# Patient Record
Sex: Female | Born: 1995 | Race: Black or African American | Hispanic: No | Marital: Single | Smoking: Never smoker
Health system: Southern US, Community
[De-identification: ages and names within clinical notes are randomized; demographics above are authoritative.]

## PROBLEM LIST (undated history)

## (undated) DIAGNOSIS — J45909 Unspecified asthma, uncomplicated: Secondary | ICD-10-CM

## (undated) DIAGNOSIS — L309 Dermatitis, unspecified: Secondary | ICD-10-CM

## (undated) DIAGNOSIS — G43909 Migraine, unspecified, not intractable, without status migrainosus: Secondary | ICD-10-CM

## (undated) HISTORY — DX: Dermatitis, unspecified: L30.9

---
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
## 2016-08-09 ENCOUNTER — Emergency Department (HOSPITAL_BASED_OUTPATIENT_CLINIC_OR_DEPARTMENT_OTHER)
Admission: EM | Admit: 2016-08-09 | Discharge: 2016-08-09 | Disposition: A | Discharge status: Home / Self Care | Payer: BC Managed Care – PPO | Attending: Emergency Medicine | Admitting: Emergency Medicine

## 2016-08-09 ENCOUNTER — Emergency Department (HOSPITAL_BASED_OUTPATIENT_CLINIC_OR_DEPARTMENT_OTHER): Payer: BC Managed Care – PPO

## 2016-08-09 ENCOUNTER — Encounter (HOSPITAL_BASED_OUTPATIENT_CLINIC_OR_DEPARTMENT_OTHER): Payer: Self-pay

## 2016-08-09 DIAGNOSIS — R079 Chest pain, unspecified: Secondary | ICD-10-CM

## 2016-08-09 DIAGNOSIS — R110 Nausea: Secondary | ICD-10-CM | POA: Diagnosis not present

## 2016-08-09 DIAGNOSIS — J029 Acute pharyngitis, unspecified: Secondary | ICD-10-CM | POA: Diagnosis not present

## 2016-08-09 DIAGNOSIS — R0781 Pleurodynia: Secondary | ICD-10-CM | POA: Diagnosis not present

## 2016-08-09 DIAGNOSIS — R05 Cough: Secondary | ICD-10-CM | POA: Insufficient documentation

## 2016-08-09 DIAGNOSIS — R51 Headache: Secondary | ICD-10-CM | POA: Diagnosis not present

## 2016-08-09 LAB — PREGNANCY, URINE: PREG TEST UR: NEGATIVE

## 2016-08-09 LAB — URINALYSIS, ROUTINE W REFLEX MICROSCOPIC
Bilirubin Urine: NEGATIVE
GLUCOSE, UA: NEGATIVE mg/dL
Hgb urine dipstick: NEGATIVE
KETONES UR: NEGATIVE mg/dL
LEUKOCYTES UA: NEGATIVE
NITRITE: NEGATIVE
PROTEIN: NEGATIVE mg/dL
Specific Gravity, Urine: 1.022 (ref 1.005–1.030)
pH: 7 (ref 5.0–8.0)

## 2016-08-09 MED ORDER — NAPROXEN 500 MG PO TABS: 500.0000 mg | ORAL_TABLET | Freq: Two times a day (BID) | ORAL | 0 refills | Status: DC

## 2016-08-09 NOTE — ED Triage Notes (Signed)
Pt c/o substernal chest pain for the last few days, developed cough today.  Saw [facility] and was tested for flu yesterday which was negative

## 2016-08-09 NOTE — ED Notes (Signed)
ED Provider at bedside. 

## 2016-08-09 NOTE — ED Provider Notes (Signed)
[facility]   CSN: [id number] Arrival date & time: 08/09/16  2000  By signing my name below, I, Anna Dillon, attest that this documentation has been prepared under the direction and in the presence of Renne CriglerJoshua Kamyra Schroeck, PA-C.  Electronically Signed: Rosario AdieWilliam Andrew Dillon, ED Scribe. 08/09/16. 10:06 PM.  History   Chief Complaint Chief Complaint  Patient presents with  . Chest Pain   The history is provided by the patient. No language interpreter was used.    HPI Comments: Anna Dillon is a 21 y.o. female with no pertinent PMHx, who presents to the Emergency Department complaining of intermittent right-sided ribcage pain beginning several days ago. She notes associated gradually improving sore throat, headache, and nausea since the onset of her pain. Pt additionally reports associated mild cough which began today. She was seen and evaluated for her pain by her [facility] yesterday. At that time she was screened for influenza which was negative. Pt has been taking Tylenol at home without relief of her symptoms. She is currently on Depo Provera for her birth control. No Hx of PE/DVT, recent long travel, surgery, fracture, prolonged immobilization otherwise. Pt notes that her sister whom she lives with is currently sick with flu-like symptoms. She denies fever, vomiting, diarrhea, leg swelling, hemoptysis, or any other associated symptoms.   History reviewed. No pertinent past medical history.  There are no active problems to display for this patient.  Past Surgical History:  Procedure Laterality Date  . TONSILLECTOMY  10/2015   OB History    No data available     Home Medications    Prior to Admission medications   Not on File   Family History No family history on file.  Social History Social History  Substance Use Topics  . Smoking status: Never Smoker  . Smokeless tobacco: Never Used  . Alcohol use Yes     Comment: rarely    Allergies   Patient has no known allergies.  Review of Systems Review of Systems  Constitutional: Negative for chills, fatigue and fever.  HENT: Positive for sore throat. Negative for congestion, ear pain, rhinorrhea and sinus pressure.   Eyes: Negative for redness.  Respiratory: Positive for cough. Negative for wheezing.        Negative for hemoptysis.  Cardiovascular: Positive for chest pain (right, ribcage). Negative for leg swelling.  Gastrointestinal: Positive for nausea. Negative for abdominal pain, diarrhea and vomiting.          Genitourinary: Negative for dysuria.  Musculoskeletal: Negative for myalgias and neck stiffness.  Skin: Negative for rash.  Neurological: Positive for headaches.  Hematological: Negative for adenopathy.   Physical Exam Updated Vital Signs BP 140/89 (BP Location: Left Arm)   Pulse 80   Temp 98.5 F (36.9 C) (Oral)   Resp 18   Ht 5\' 7"  (1.702 m)   Wt 140 lb (63.5 kg)   SpO2 100%   BMI 21.93 kg/m   Physical Exam  Constitutional: She appears well-developed and well-nourished. No distress.  HENT:  Head: Normocephalic and atraumatic.  Eyes: Conjunctivae are normal. Right eye exhibits no discharge. Left eye exhibits no discharge.  Neck: Normal range of motion. Neck supple.  Cardiovascular: Normal rate, regular rhythm and normal heart sounds.   Pulmonary/Chest: Effort normal and breath sounds normal. She exhibits tenderness (Mild, right anterior inferior ribs).  Abdominal: Soft. She exhibits no distension. There is no tenderness.  Musculoskeletal: Normal range of motion. She exhibits no edema.  Neurological: She is alert.  Skin: Skin is warm and dry. No pallor.  Psychiatric: She has a normal mood and affect. Her behavior is normal.  Nursing note and vitals reviewed.  ED Treatments / Results  DIAGNOSTIC STUDIES: Oxygen Saturation is 100% on RA, normal by my interpretation.   COORDINATION OF CARE: 9:56 PM-Discussed next steps with pt  including usage of OTC medications for symptomatic treatment at home and prescription for cough suppressant. Pt verbalized understanding and is agreeable with the plan.   Labs (all labs ordered are listed, but only abnormal results are displayed) Labs Reviewed  URINALYSIS, ROUTINE W REFLEX MICROSCOPIC  PREGNANCY, URINE    EKG  EKG Interpretation None      Radiology Dg Chest 2 View  Result Date: 08/09/2016 CLINICAL DATA:  Patient with sub sternal chest pain for multiple days. Cough. EXAM: CHEST  2 VIEW COMPARISON:  None. FINDINGS: Normal cardiac and mediastinal contours. No consolidative pulmonary opacities. No pleural effusion or pneumothorax. IMPRESSION: No active cardiopulmonary disease. Electronically Signed   By: Annia Belt M.D.   On: 08/09/2016 21:01   Procedures Procedures   Medications Ordered in ED Medications - No data to display  Initial Impression / Assessment and Plan / ED Course  I have reviewed the triage vital signs and the nursing notes.  Pertinent labs & imaging results that were available during my care of the patient were reviewed by me and considered in my medical decision making (see chart for details).     Vital signs reviewed and are as follows: Vitals:   08/09/16 2014  BP: 140/89  Pulse: 80  Resp: 18  Temp: 98.5 F (36.9 C)   10:11 PM Patient counseled on supportive care  and s/s to return including worsening symptoms, persistent fever, persistent vomiting, or if they have any other concerns. Urged to see PCP if symptoms persist for more than 3 days. Patient verbalizes understanding and agrees with plan.    Final Clinical Impressions(s) / ED Diagnoses   Final diagnoses:  Nonspecific chest pain   Patient with chest wall tenderness, signs and symptoms suspicious for upper respiratory infection. Chest x-ray is clear. Patient is PERC negative. No pneumothorax or pneumonia. Supportive measures with return if symptoms worsen.  New  Prescriptions New Prescriptions   NAPROXEN (NAPROSYN) 500 MG TABLET    Take 1 tablet (500 mg total) by mouth 2 (two) times daily.   I personally performed the services described in this documentation, which was scribed in my presence. The recorded information has been reviewed and is accurate.     Renne Crigler, PA-C 08/09/16 2212    Canary Brim Tegeler, MD 08/10/16 [phone]

## 2016-08-09 NOTE — Discharge Instructions (Signed)
Please read and follow all provided instructions.  Your diagnoses today include:  1. Nonspecific chest pain     Tests performed today include:  Chest x-ray - no pneumonia or other problems  Vital signs. See below for your results today.   Medications prescribed:   Naproxen - anti-inflammatory pain medication  Do not exceed 500mg  naproxen every 12 hours, take with food  You have been prescribed an anti-inflammatory medication or NSAID. Take with food. Take smallest effective dose for the shortest duration needed for your pain. Stop taking if you experience stomach pain or vomiting.   Take any prescribed medications only as directed.  Home care instructions:  Follow any educational materials contained in this packet.  BE VERY CAREFUL not to take multiple medicines containing Tylenol (also called acetaminophen). Doing so can lead to an overdose which can damage your liver and cause liver failure and possibly death.   Follow-up instructions: Please follow-up with your primary care provider in the next 3 days for further evaluation of your symptoms.   Return instructions:   Please return to the Emergency Department if you experience worsening symptoms.   Please return if you have any other emergent concerns.  Additional Information:  Your vital signs today were: BP 140/89 (BP Location: Left Arm)    Pulse 80    Temp 98.5 F (36.9 C) (Oral)    Resp 18    Ht 5\' 7"  (1.702 m)    Wt 63.5 kg    SpO2 100%    BMI 21.93 kg/m  If your blood pressure (BP) was elevated above 135/85 this visit, please have this repeated by your doctor within one month. --------------

## 2016-08-11 ENCOUNTER — Emergency Department (HOSPITAL_BASED_OUTPATIENT_CLINIC_OR_DEPARTMENT_OTHER)
Admission: EM | Admit: 2016-08-11 | Discharge: 2016-08-11 | Disposition: A | Discharge status: Home / Self Care | Payer: BC Managed Care – PPO | Attending: Emergency Medicine | Admitting: Emergency Medicine

## 2016-08-11 ENCOUNTER — Emergency Department (HOSPITAL_BASED_OUTPATIENT_CLINIC_OR_DEPARTMENT_OTHER): Payer: BC Managed Care – PPO

## 2016-08-11 ENCOUNTER — Encounter (HOSPITAL_BASED_OUTPATIENT_CLINIC_OR_DEPARTMENT_OTHER): Payer: Self-pay | Admitting: *Deleted

## 2016-08-11 DIAGNOSIS — R110 Nausea: Secondary | ICD-10-CM | POA: Diagnosis not present

## 2016-08-11 DIAGNOSIS — R1011 Right upper quadrant pain: Secondary | ICD-10-CM | POA: Insufficient documentation

## 2016-08-11 DIAGNOSIS — R109 Unspecified abdominal pain: Secondary | ICD-10-CM

## 2016-08-11 LAB — URINALYSIS, ROUTINE W REFLEX MICROSCOPIC
Bilirubin Urine: NEGATIVE
Glucose, UA: NEGATIVE mg/dL
HGB URINE DIPSTICK: NEGATIVE
Ketones, ur: 15 mg/dL — AB
Nitrite: NEGATIVE
PROTEIN: NEGATIVE mg/dL
SPECIFIC GRAVITY, URINE: 1.031 — AB (ref 1.005–1.030)
pH: 7 (ref 5.0–8.0)

## 2016-08-11 LAB — PREGNANCY, URINE: Preg Test, Ur: NEGATIVE

## 2016-08-11 LAB — URINALYSIS, MICROSCOPIC (REFLEX): RBC / HPF: NONE SEEN RBC/hpf (ref 0–5)

## 2016-08-11 MED ORDER — CEPHALEXIN 500 MG PO CAPS: 500.0000 mg | ORAL_CAPSULE | Freq: Two times a day (BID) | ORAL | 0 refills | Status: DC

## 2016-08-11 MED ORDER — KETOROLAC TROMETHAMINE 30 MG/ML IJ SOLN
30.0000 mg | Freq: Once | INTRAMUSCULAR | Status: AC
  Administered 2016-08-11: 30 mg via INTRAMUSCULAR
  Filled 2016-08-11: qty 1

## 2016-08-11 NOTE — Discharge Instructions (Signed)
Please take the Keflex twice a day for 7 days. May continue your pain medicine at home. May also use Tylenol. Please stay hydrated plenty of water. You need to find a primary care doctor to follow up with. Return to the ED if you developed any worsening symptoms. CAT scan was normal today. Her ultrasound was normal today. Your urine shows signs of infection. Return to the ED if you developed any fevers or worsening urinary symptoms.

## 2016-08-11 NOTE — ED Notes (Signed)
Brought patient a cup of ice at her request and with nurse's approval

## 2016-08-11 NOTE — ED Triage Notes (Signed)
States right side and back pain.  Was seen and treated for same 2 days ago-rx naproxyn-states that she has taken one without relief.

## 2016-08-11 NOTE — ED Notes (Signed)
Pt explained that she could not eat or drink until results came back.

## 2016-08-11 NOTE — ED Provider Notes (Signed)
[facility]   CSN: [id number] Arrival date & time: 08/11/16  1420  By signing my name below, I, Modena Jansky, attest that this documentation has been prepared under the direction and in the presence of non-physician practitioner, Azucena Kuba, PA-C. Electronically Signed: Modena Jansky, Scribe. 08/11/2016. 5:09 PM.  History   Chief Complaint Chief Complaint  Patient presents with  . Back Pain   The history is provided by the patient. No language interpreter was used.   HPI Comments: Anna Dillon is a 21 y.o. female who presents to the Emergency Department complaining of constant moderate right-sided flank pain that started about 5 days ago. She was seen here in the ED on 08/09/16 for the same complaint and discharged with naproxen. Her pain was initially in her right rib pain during ED visit and is now radiating to her right flank. She took one naproxen dose without any relief. Her pain was exacerbated by movement. She reports associated nausea. Not associated with food. She admits to being sexually active and uses protection but has no concern for STD exposure (recently tested). She denies any hx of kidney/abdominal problems, fever, vomiting, diarrhea, constipation, blood in stool, urinary symptoms, vaginal discharge/bleeding, pelvic pain, denies hx of dvt, lower extremity edema, prolonged immobilization, recent hospitalizations/surgeries, ocps, or other complaints.   History reviewed. No pertinent past medical history.  There are no active problems to display for this patient.   Past Surgical History:  Procedure Laterality Date  . TONSILLECTOMY  10/2015    OB History    No data available       Home Medications    Prior to Admission medications   Medication Sig Start Date End Date Taking? Authorizing Provider  naproxen (NAPROSYN) 500 MG tablet Take 1 tablet (500 mg total) by mouth 2 (two) times daily. 08/09/16   Renne Crigler, PA-C    Family  History History reviewed. No pertinent family history.  Social History Social History  Substance Use Topics  . Smoking status: Never Smoker  . Smokeless tobacco: Never Used  . Alcohol use Yes     Comment: rarely     Allergies   Patient has no known allergies.   Review of Systems Review of Systems  Constitutional: Negative for chills and fever.  HENT: Negative for congestion.   Eyes: Negative for visual disturbance.  Respiratory: Negative for cough and shortness of breath.   Cardiovascular: Negative for chest pain, palpitations and leg swelling.  Gastrointestinal: Negative for abdominal pain, blood in stool, constipation, diarrhea and vomiting.  Genitourinary: Positive for flank pain. Negative for dysuria, frequency, hematuria, pelvic pain, urgency, vaginal bleeding and vaginal discharge.  Musculoskeletal: Positive for back pain.  Neurological: Negative for dizziness, syncope, light-headedness and headaches.  All other systems reviewed and are negative.    Physical Exam Updated Vital Signs BP 136/92 (BP Location: Left Arm)   Pulse 83   Temp 98.4 F (36.9 C) (Oral)   Resp 20   Ht 5\' 7"  (1.702 m)   Wt 140 lb (63.5 kg)   SpO2 97%   BMI 21.93 kg/m   Physical Exam  Constitutional: She is oriented to person, place, and time. She appears well-developed and well-nourished. No distress.  Pt is non toxic appearing and playing on cell phone. She is resting comfortable on bed in nad.  HENT:  Head: Normocephalic and atraumatic.  Eyes: Conjunctivae and EOM are normal. Pupils are equal, round, and reactive to light.  Neck: Normal range of motion. Neck  supple.  Cardiovascular: Normal rate, regular rhythm, normal heart sounds and intact distal pulses.  Exam reveals no gallop and no friction rub.   No murmur heard. Pulmonary/Chest: Effort normal and breath sounds normal. She exhibits no tenderness.  CTAB  Abdominal: Soft. Bowel sounds are normal. She exhibits no distension. There  is no tenderness. There is CVA tenderness (right). There is no rebound and no guarding.  Musculoskeletal: Normal range of motion.  Lymphadenopathy:    She has no cervical adenopathy.  Neurological: She is alert and oriented to person, place, and time.  Skin: Skin is warm and dry.  Psychiatric: She has a normal mood and affect.  Nursing note and vitals reviewed.    ED Treatments / Results  DIAGNOSTIC STUDIES: Oxygen Saturation is 97% on RA, normal by my interpretation.    COORDINATION OF CARE: 5:13 PM- Pt advised of plan for treatment and pt agrees.  Labs (all labs ordered are listed, but only abnormal results are displayed) Labs Reviewed  URINALYSIS, ROUTINE W REFLEX MICROSCOPIC - Abnormal; Notable for the following:       Result Value   Color, Urine ORANGE (*)    Specific Gravity, Urine 1.031 (*)    Ketones, ur 15 (*)    Leukocytes, UA SMALL (*)    All other components within normal limits  URINALYSIS, MICROSCOPIC (REFLEX) - Abnormal; Notable for the following:    Bacteria, UA MANY (*)    Squamous Epithelial / LPF 0-5 (*)    All other components within normal limits  URINE CULTURE  PREGNANCY, URINE    EKG  EKG Interpretation None       Radiology [place] Abdomen Complete  Result Date: 08/11/2016 CLINICAL DATA:  Acute onset of right upper quadrant and right flank pain. Nausea and question of dehydration. Dark urine. Initial encounter. EXAM: ABDOMEN ULTRASOUND COMPLETE COMPARISON:  None. FINDINGS: Gallbladder: No gallstones or wall thickening visualized. No sonographic Murphy sign noted by sonographer. Common bile duct: Diameter: 0.3 cm, within normal limits in caliber. Liver: No focal lesion identified. Mildly increased parenchymal echogenicity may reflect fatty infiltration. IVC: No abnormality visualized. Pancreas: Not visualized due to overlying bowel gas. Spleen: Size and appearance within normal limits. Right Kidney: Length: 10.5 cm. Echogenicity within normal limits. No  mass or hydronephrosis visualized. Left Kidney: Length: 10.5 cm. Echogenicity within normal limits. No mass or hydronephrosis visualized. Abdominal aorta: No aneurysm visualized. The abdominal aorta is largely obscured by overlying bowel gas. Other findings: None. IMPRESSION: 1. No acute abnormality seen within the abdomen. Evaluation is mildly suboptimal due to bowel gas. 2. Mild fatty infiltration within the liver. Electronically Signed   By: Roanna RaiderJeffery  Chang M.D.   On: 08/11/2016 18:10   Ct Renal Stone Study  Result Date: 08/11/2016 CLINICAL DATA:  Right-sided flank pain EXAM: CT ABDOMEN AND PELVIS WITHOUT CONTRAST TECHNIQUE: Multidetector CT imaging of the abdomen and pelvis was performed following the standard protocol without IV contrast. COMPARISON:  None. FINDINGS: Lower chest: No acute abnormality. Hepatobiliary: No focal liver abnormality is seen. No gallstones, gallbladder wall thickening, or biliary dilatation. Pancreas: Unremarkable. No pancreatic ductal dilatation or surrounding inflammatory changes. Spleen: Normal in size without focal abnormality. Adrenals/Urinary Tract: The adrenal glands are within normal limits bilaterally. The kidneys are well visualize without evidence of renal calculi or obstructive changes. The bladder is well distended. Stomach/Bowel: Stomach is within normal limits. Appendix appears normal. No evidence of bowel wall thickening, distention, or inflammatory changes. Vascular/Lymphatic: No significant vascular findings are present. No  enlarged abdominal or pelvic lymph nodes. Reproductive: Uterus and bilateral adnexa are unremarkable. Other: No abdominal wall hernia or abnormality. No abdominopelvic ascites. Musculoskeletal: No acute or significant osseous findings. IMPRESSION: No acute abnormality noted. Electronically Signed   By: Alcide Clever M.D.   On: 08/11/2016 19:03    Procedures Procedures (including critical care time)  Medications Ordered in ED Medications   ketorolac (TORADOL) 30 MG/ML injection 30 mg (30 mg Intramuscular Given 08/11/16 1831)     Initial Impression / Assessment and Plan / ED Course  I have reviewed the triage vital signs and the nursing notes.  Pertinent labs & imaging results that were available during my care of the patient were reviewed by me and considered in my medical decision making (see chart for details).     Pt presents with right flank pain and ruq pain. Pt recently seen for same and diagnosed with muscle strain. Given naproxen. Exam consistent with right CVA tenderness. [place] with many bacteria, and small amount of leukocytes. She denies any urinary symptoms. Denies any vaginal complaints. Recently tested for STD and was [facility]. She uses protection with one female partner. No concern for STD. Urine preg is negative. Do not feel pelvic exam is indicated. Ordered a abd [place] to access kidneys for stranding and possible pyelo. Pt endorses nauseas. Denies emesis or fevers. US shows not acute abnormalities. Does show fatty infiltrate of liver. Informed of findings. Pt states she is still in pain and is requesting that we perform a ct bc she things that it is an appendicitis. Dicussed with pt risk of ct scans. She would like it performed. CT is unremarkable. Appendix is normal. Kidney is norma. Pt is PERC negative. Lows suspicion for PE. Pt is improved with Toradol. Will treat with kefelx for 7 days for possible UTI. Urine cultured. Feel that pain is msk. Encouraged her to continue naproxen. And follow up with pcp.Pt is non toxic appearing. She is not febrile, tachycardic, hypoxic or tachypneic. She is sleeping on my reexamination. No red flag symptoms for back pain. Pt is hemodynamically stable, in NAD, & able to ambulate in the ED. Pain has been managed & has no complaints prior to dc. Pt is comfortable with above plan and is stable for discharge at this time. All questions were answered prior to disposition. Strict  return precautions for f/u to the ED were discussed.   Final Clinical Impressions(s) / ED Diagnoses   Final diagnoses:  Right flank pain  RUQ pain    New Prescriptions Discharge Medication List as of 08/11/2016  8:14 PM    START taking these medications   Details  cephALEXin (KEFLEX) 500 MG capsule Take 1 capsule (500 mg total) by mouth 2 (two) times daily., Starting Sat 08/11/2016, Print       I personally performed the services described in this documentation, which was scribed in my presence. The recorded information has been reviewed and is accurate.     Rise Mu, PA-C 08/12/16 1426    Rolland Porter, MD 08/19/16 [phone]

## 2016-08-13 LAB — URINE CULTURE

## 2016-11-05 ENCOUNTER — Emergency Department (HOSPITAL_BASED_OUTPATIENT_CLINIC_OR_DEPARTMENT_OTHER)
Admission: EM | Admit: 2016-11-05 | Discharge: 2016-11-06 | Disposition: A | Discharge status: Home / Self Care | Payer: BC Managed Care – PPO | Attending: Emergency Medicine | Admitting: Emergency Medicine

## 2016-11-05 ENCOUNTER — Encounter (HOSPITAL_BASED_OUTPATIENT_CLINIC_OR_DEPARTMENT_OTHER): Payer: Self-pay | Admitting: *Deleted

## 2016-11-05 ENCOUNTER — Emergency Department (HOSPITAL_BASED_OUTPATIENT_CLINIC_OR_DEPARTMENT_OTHER): Payer: BC Managed Care – PPO

## 2016-11-05 DIAGNOSIS — R1032 Left lower quadrant pain: Secondary | ICD-10-CM

## 2016-11-05 DIAGNOSIS — Z711 Person with feared health complaint in whom no diagnosis is made: Secondary | ICD-10-CM

## 2016-11-05 DIAGNOSIS — N898 Other specified noninflammatory disorders of vagina: Secondary | ICD-10-CM | POA: Diagnosis not present

## 2016-11-05 DIAGNOSIS — R110 Nausea: Secondary | ICD-10-CM | POA: Insufficient documentation

## 2016-11-05 DIAGNOSIS — R102 Pelvic and perineal pain: Secondary | ICD-10-CM

## 2016-11-05 DIAGNOSIS — Z202 Contact with and (suspected) exposure to infections with a predominantly sexual mode of transmission: Secondary | ICD-10-CM | POA: Insufficient documentation

## 2016-11-05 DIAGNOSIS — R197 Diarrhea, unspecified: Secondary | ICD-10-CM | POA: Diagnosis not present

## 2016-11-05 LAB — URINALYSIS, ROUTINE W REFLEX MICROSCOPIC
Bilirubin Urine: NEGATIVE
Glucose, UA: NEGATIVE mg/dL
Hgb urine dipstick: NEGATIVE
KETONES UR: NEGATIVE mg/dL
LEUKOCYTES UA: NEGATIVE
NITRITE: NEGATIVE
PROTEIN: NEGATIVE mg/dL
Specific Gravity, Urine: 1.029 (ref 1.005–1.030)
pH: 6 (ref 5.0–8.0)

## 2016-11-05 LAB — CBC WITH DIFFERENTIAL/PLATELET
BASOS ABS: 0 10*3/uL (ref 0.0–0.1)
Basophils Relative: 1 %
EOS PCT: 0 %
Eosinophils Absolute: 0 10*3/uL (ref 0.0–0.7)
HEMATOCRIT: 37.2 % (ref 36.0–46.0)
Hemoglobin: 13.1 g/dL (ref 12.0–15.0)
LYMPHS ABS: 2.4 10*3/uL (ref 0.7–4.0)
LYMPHS PCT: 32 %
MCH: 30.8 pg (ref 26.0–34.0)
MCHC: 35.2 g/dL (ref 30.0–36.0)
MCV: 87.3 fL (ref 78.0–100.0)
MONO ABS: 0.5 10*3/uL (ref 0.1–1.0)
Monocytes Relative: 6 %
NEUTROS ABS: 4.7 10*3/uL (ref 1.7–7.7)
NEUTROS PCT: 61 %
Platelets: 197 10*3/uL (ref 150–400)
RBC: 4.26 MIL/uL (ref 3.87–5.11)
RDW: 12.6 % (ref 11.5–15.5)
WBC: 7.7 10*3/uL (ref 4.0–10.5)

## 2016-11-05 LAB — COMPREHENSIVE METABOLIC PANEL
ALT: 16 U/L (ref 14–54)
AST: 20 U/L (ref 15–41)
Albumin: 4 g/dL (ref 3.5–5.0)
Alkaline Phosphatase: 40 U/L (ref 38–126)
Anion gap: 7 (ref 5–15)
BILIRUBIN TOTAL: 0.6 mg/dL (ref 0.3–1.2)
BUN: 8 mg/dL (ref 6–20)
CO2: 22 mmol/L (ref 22–32)
CREATININE: 0.72 mg/dL (ref 0.44–1.00)
Calcium: 8.8 mg/dL — ABNORMAL LOW (ref 8.9–10.3)
Chloride: 109 mmol/L (ref 101–111)
Glucose, Bld: 93 mg/dL (ref 65–99)
Potassium: 3.7 mmol/L (ref 3.5–5.1)
Sodium: 138 mmol/L (ref 135–145)
TOTAL PROTEIN: 6.6 g/dL (ref 6.5–8.1)

## 2016-11-05 LAB — WET PREP, GENITAL
Clue Cells Wet Prep HPF POC: NONE SEEN
Sperm: NONE SEEN
Trich, Wet Prep: NONE SEEN
YEAST WET PREP: NONE SEEN

## 2016-11-05 LAB — PREGNANCY, URINE: PREG TEST UR: NEGATIVE

## 2016-11-05 LAB — LIPASE, BLOOD: LIPASE: 28 U/L (ref 11–51)

## 2016-11-05 MED ORDER — ONDANSETRON 4 MG PO TBDP
4.0000 mg | ORAL_TABLET | Freq: Once | ORAL | Status: AC
  Administered 2016-11-06: 4 mg via ORAL
  Filled 2016-11-05: qty 1

## 2016-11-05 MED ORDER — ACETAMINOPHEN 325 MG PO TABS
650.0000 mg | ORAL_TABLET | Freq: Once | ORAL | Status: AC
  Administered 2016-11-05: 650 mg via ORAL
  Filled 2016-11-05: qty 2

## 2016-11-05 NOTE — ED Notes (Signed)
ED Provider at bedside. 

## 2016-11-05 NOTE — ED Notes (Signed)
Patient transported to Ultrasound 

## 2016-11-05 NOTE — ED Notes (Signed)
Went to room to draw blood but pt is in US at this time.

## 2016-11-05 NOTE — ED Provider Notes (Signed)
[facility] Note   CSN: [id number] Arrival date & time: 11/05/16  2154  By signing my name below, I, Nelwyn Salisbury, attest that this documentation has been prepared under the direction and in the presence of non-physician practitioner, Everlene Farrier, PA-C.  Electronically Signed: Nelwyn Salisbury, Scribe. 11/05/2016. 10:11 PM.  History   Chief Complaint Chief Complaint  Patient presents with  . Abdominal Pain   The history is provided by the patient. No language interpreter was used.    HPI Comments:  Anna Dillon is an otherwise healthy 21 y.o. female who presents to the Emergency Department complaining of constant, mild, left-sided abdominal pain beginning last night. She describes her pain as a burning sensation. Pt reports associated nausea, one episode of diarrhea, and dark vaginal discharge. She notes that she is sexually active and does not use protection but is currently taking Depo for birth control. No previous abdominal surgeries. No treatments prior to arrival. Denies any fevers, dysuria, hematuria, pelvic pain or burping. No abdominal shx.   History reviewed. No pertinent past medical history.  There are no active problems to display for this patient.   Past Surgical History:  Procedure Laterality Date  . TONSILLECTOMY  10/2015    OB History    No data available       Home Medications    Prior to Admission medications   Medication Sig Start Date End Date Taking? Authorizing Provider  doxycycline (VIBRAMYCIN) 100 MG capsule Take 1 capsule (100 mg total) by mouth 2 (two) times daily. 11/06/16   Everlene Farrier, PA-C  naproxen (NAPROSYN) 250 MG tablet Take 1 tablet (250 mg total) by mouth 2 (two) times daily with a meal. 11/06/16   Everlene Farrier, PA-C  ondansetron (ZOFRAN ODT) 4 MG disintegrating tablet Take 1 tablet (4 mg total) by mouth every 8 (eight) hours as needed for nausea or vomiting. 11/06/16   Everlene Farrier, PA-C    Family History No  family history on file.  Social History Social History  Substance Use Topics  . Smoking status: Never Smoker  . Smokeless tobacco: Never Used  . Alcohol use Yes     Comment: rarely     Allergies   Augmentin [amoxicillin-pot clavulanate]   Review of Systems Review of Systems  Constitutional: Negative for chills and fever.  HENT: Negative for congestion and mouth sores.        Negative for burping  Eyes: Negative for visual disturbance.  Respiratory: Negative for cough and shortness of breath.   Cardiovascular: Negative for chest pain.  Gastrointestinal: Positive for abdominal pain, diarrhea and nausea. Negative for constipation and vomiting.  Genitourinary: Positive for vaginal discharge. Negative for difficulty urinating, dysuria, frequency, hematuria and vaginal bleeding.  Musculoskeletal: Negative for back pain and neck pain.  Skin: Negative for rash.  Neurological: Negative for headaches.     Physical Exam Updated Vital Signs BP 118/86   Pulse 91   Temp 98.6 F (37 C) (Oral)   Resp 18   Ht 5' 7.5" (1.715 m)   Wt 65.8 kg   SpO2 99%   BMI 22.38 kg/m   Physical Exam  Constitutional: She appears well-developed and well-nourished. No distress.  Nontoxic appearing.  HENT:  Head: Normocephalic and atraumatic.  Mouth/Throat: Oropharynx is clear and moist.  Eyes: Conjunctivae are normal. Pupils are equal, round, and reactive to light. Right eye exhibits no discharge. Left eye exhibits no discharge.  Neck: Neck supple.  Cardiovascular: Normal rate, regular rhythm, normal heart  sounds and intact distal pulses.  Exam reveals no gallop and no friction rub.   No murmur heard. Pulmonary/Chest: Effort normal and breath sounds normal. No respiratory distress. She has no wheezes. She has no rales.  Abdominal: Soft. Bowel sounds are normal. She exhibits no distension and no mass. There is tenderness. There is no rebound and no guarding. No hernia.  Abdomen is soft.  Bowel  sounds present. LLQ tenderness to palpation. No rebound tenderness. No CVA or flank tenderness. No Psoas or Obturator sign. No peritoneal signs.   Genitourinary: Vaginal discharge found.  Genitourinary Comments: Pelvic exam performed by me with female nurse tech chaperone. No external lesions or rashes noted. Mild amount of dark vaginal discharge noted. No vaginal bleeding. Cervix is closed. No cervical motion tenderness. Mild tenderness noted to her left adnexal area.  Musculoskeletal: She exhibits no edema.  Lymphadenopathy:    She has no cervical adenopathy.  Neurological: She is alert. Coordination normal.  Skin: Skin is warm and dry. Capillary refill takes less than 2 seconds. No rash noted. She is not diaphoretic. No erythema. No pallor.  Psychiatric: She has a normal mood and affect. Her behavior is normal.  Nursing note and vitals reviewed.   ED Treatments / Results  DIAGNOSTIC STUDIES:  Oxygen Saturation is 99% on RA, normal by my interpretation.    COORDINATION OF CARE:  10:31 PM Discussed treatment plan with pt at bedside which includes a pelvic exam and pt agreed to plan.  Labs (all labs ordered are listed, but only abnormal results are displayed) Labs Reviewed  WET PREP, GENITAL - Abnormal; Notable for the following:       Result Value   WBC, Wet Prep HPF POC MANY (*)    All other components within normal limits  COMPREHENSIVE METABOLIC PANEL - Abnormal; Notable for the following:    Calcium 8.8 (*)    All other components within normal limits  URINALYSIS, ROUTINE W REFLEX MICROSCOPIC  PREGNANCY, URINE  CBC WITH DIFFERENTIAL/PLATELET  LIPASE, BLOOD  GC/CHLAMYDIA PROBE AMP ([place]) NOT AT [facility]    EKG  EKG Interpretation None       Radiology US Transvaginal Non-ob  Result Date: 11/05/2016 CLINICAL DATA:  21 y/o  F; left lower quadrant burning pain. EXAM: TRANSABDOMINAL AND TRANSVAGINAL ULTRASOUND OF PELVIS DOPPLER ULTRASOUND OF OVARIES TECHNIQUE: Both  transabdominal and transvaginal ultrasound examinations of the pelvis were performed. Transabdominal technique was performed for global imaging of the pelvis including uterus, ovaries, adnexal regions, and pelvic cul-de-sac. It was necessary to proceed with endovaginal exam following the transabdominal exam to visualize the endometrium and adnexa. Color and duplex Doppler ultrasound was utilized to evaluate blood flow to the ovaries. COMPARISON:  08/11/2016 CT abdomen and pelvis FINDINGS: Uterus Measurements: 6 x 2.5 x 3.3 cm. No fibroids or other mass visualized. Endometrium Thickness: 3.1 mm.  No focal abnormality visualized. Right ovary Measurements: 3.9 x 2.2 x 2.6 cm. Normal appearance/no adnexal mass. Left ovary Measurements: 3.4 x 2.2 x 1.9 cm. Normal appearance/no adnexal mass. Pulsed Doppler evaluation of both ovaries demonstrates normal low-resistance arterial and venous waveforms. Other findings No abnormal free fluid. IMPRESSION: No acute process identified. Unremarkable pelvic ultrasound and Doppler. Electronically Signed   By: Mitzi Hansen M.D.   On: 11/05/2016 23:13   US Pelvis Complete  Result Date: 11/05/2016 CLINICAL DATA:  21 y/o  F; left lower quadrant burning pain. EXAM: TRANSABDOMINAL AND TRANSVAGINAL ULTRASOUND OF PELVIS DOPPLER ULTRASOUND OF OVARIES TECHNIQUE: Both transabdominal and  transvaginal ultrasound examinations of the pelvis were performed. Transabdominal technique was performed for global imaging of the pelvis including uterus, ovaries, adnexal regions, and pelvic cul-de-sac. It was necessary to proceed with endovaginal exam following the transabdominal exam to visualize the endometrium and adnexa. Color and duplex Doppler ultrasound was utilized to evaluate blood flow to the ovaries. COMPARISON:  08/11/2016 CT abdomen and pelvis FINDINGS: Uterus Measurements: 6 x 2.5 x 3.3 cm. No fibroids or other mass visualized. Endometrium Thickness: 3.1 mm.  No focal abnormality  visualized. Right ovary Measurements: 3.9 x 2.2 x 2.6 cm. Normal appearance/no adnexal mass. Left ovary Measurements: 3.4 x 2.2 x 1.9 cm. Normal appearance/no adnexal mass. Pulsed Doppler evaluation of both ovaries demonstrates normal low-resistance arterial and venous waveforms. Other findings No abnormal free fluid. IMPRESSION: No acute process identified. Unremarkable pelvic ultrasound and Doppler. Electronically Signed   By: Mitzi Hansen M.D.   On: 11/05/2016 23:13   [place] Art/ven Flow Abd Pelv Doppler  Result Date: 11/05/2016 CLINICAL DATA:  21 y/o  F; left lower quadrant burning pain. EXAM: TRANSABDOMINAL AND TRANSVAGINAL ULTRASOUND OF PELVIS DOPPLER ULTRASOUND OF OVARIES TECHNIQUE: Both transabdominal and transvaginal ultrasound examinations of the pelvis were performed. Transabdominal technique was performed for global imaging of the pelvis including uterus, ovaries, adnexal regions, and pelvic cul-de-sac. It was necessary to proceed with endovaginal exam following the transabdominal exam to visualize the endometrium and adnexa. Color and duplex Doppler ultrasound was utilized to evaluate blood flow to the ovaries. COMPARISON:  08/11/2016 CT abdomen and pelvis FINDINGS: Uterus Measurements: 6 x 2.5 x 3.3 cm. No fibroids or other mass visualized. Endometrium Thickness: 3.1 mm.  No focal abnormality visualized. Right ovary Measurements: 3.9 x 2.2 x 2.6 cm. Normal appearance/no adnexal mass. Left ovary Measurements: 3.4 x 2.2 x 1.9 cm. Normal appearance/no adnexal mass. Pulsed Doppler evaluation of both ovaries demonstrates normal low-resistance arterial and venous waveforms. Other findings No abnormal free fluid. IMPRESSION: No acute process identified. Unremarkable pelvic ultrasound and Doppler. Electronically Signed   By: Mitzi Hansen M.D.   On: 11/05/2016 23:13    Procedures Procedures (including critical care time)  Medications Ordered in ED Medications  acetaminophen  (TYLENOL) tablet 650 mg (650 mg Oral Given 11/05/16 2318)  ondansetron (ZOFRAN-ODT) disintegrating tablet 4 mg (4 mg Oral Given 11/06/16 0034)  cefTRIAXone (ROCEPHIN) injection 250 mg (250 mg Intramuscular Given 11/06/16 0033)  azithromycin (ZITHROMAX) tablet 1,000 mg (1,000 mg Oral Given 11/06/16 0033)     Initial Impression / Assessment and Plan / ED Course  I have reviewed the triage vital signs and the nursing notes.  Pertinent labs & imaging results that were available during my care of the patient were reviewed by me and considered in my medical decision making (see chart for details).     This is an otherwise healthy 21 y.o. female who presents to the Emergency Department complaining of constant, mild, left-sided abdominal pain beginning last night. She describes her pain as a burning sensation. Pt reports associated nausea, one episode of diarrhea, and dark vaginal discharge. She notes that she is sexually active and does not use protection but is currently taking Depo for birth control. No previous abdominal surgeries. On exam the patient is afebrile and nontoxic appearing. Her abdomen is soft and she is left lower quadrant tenderness to palpation. On pelvic exam she has some dark brown vaginal discharge. Cervix is) she has no cervical motion tenderness but she does have some left adnexal tenderness. Pregnancy test is negative.  Urinalysis is without sign of infection. Lipase is within normal limits. CBC is unremarkable. No leukocytosis. CMP is unremarkable. Pelvic ultrasound shows no evidence of torsion or suspicious masses. Wet prep returned showing many white blood cells. Concern for STD. Pending gonorrhea and Chlamydia testing. Patient agrees to treatment with antibiotics here prophylactically for gonorrhea and chlamydia. As she is having pelvic pain as well will treat her for PID with doxy, despite her lack of cervical motion tenderness. I discussed strict and specific return precautions. I  extensively discussed safe sex practices. I encouraged her to follow-up at the health department and with her OB. She is tolerating PO at reevaluation. I advised the patient to follow-up with their primary care provider this week. I advised the patient to return to the emergency department with new or worsening symptoms or new concerns. The patient verbalized understanding and agreement with plan.      Final Clinical Impressions(s) / ED Diagnoses   Final diagnoses:  Left lower quadrant pain  Concern about STD in female without diagnosis  Pelvic pain in female  Nausea    New Prescriptions New Prescriptions   DOXYCYCLINE (VIBRAMYCIN) 100 MG CAPSULE    Take 1 capsule (100 mg total) by mouth 2 (two) times daily.   NAPROXEN (NAPROSYN) 250 MG TABLET    Take 1 tablet (250 mg total) by mouth 2 (two) times daily with a meal.   ONDANSETRON (ZOFRAN ODT) 4 MG DISINTEGRATING TABLET    Take 1 tablet (4 mg total) by mouth every 8 (eight) hours as needed for nausea or vomiting.   I personally performed the services described in this documentation, which was scribed in my presence. The recorded information has been reviewed and is accurate.      Everlene FarrierDansie, Montine Hight, PA-C 11/06/16 0041    Maia PlanLong, Joshua G, MD 11/06/16 1115

## 2016-11-05 NOTE — ED Triage Notes (Signed)
Abdominal pain and nausea since last night. Burning feeling on the left of her mid abdomen.

## 2016-11-05 NOTE — ED Notes (Signed)
Pt remains in US

## 2016-11-06 DIAGNOSIS — Z202 Contact with and (suspected) exposure to infections with a predominantly sexual mode of transmission: Secondary | ICD-10-CM | POA: Diagnosis not present

## 2016-11-06 MED ORDER — ONDANSETRON 4 MG PO TBDP: 4.0000 mg | ORAL_TABLET | Freq: Three times a day (TID) | ORAL | 0 refills | Status: DC | PRN

## 2016-11-06 MED ORDER — AZITHROMYCIN 250 MG PO TABS
1000.0000 mg | ORAL_TABLET | Freq: Once | ORAL | Status: AC
  Administered 2016-11-06: 1000 mg via ORAL
  Filled 2016-11-06: qty 4

## 2016-11-06 MED ORDER — DOXYCYCLINE HYCLATE 100 MG PO CAPS: 100.0000 mg | ORAL_CAPSULE | Freq: Two times a day (BID) | ORAL | 0 refills | Status: DC

## 2016-11-06 MED ORDER — NAPROXEN 250 MG PO TABS: 250.0000 mg | ORAL_TABLET | Freq: Two times a day (BID) | ORAL | 0 refills | Status: DC

## 2016-11-06 MED ORDER — CEFTRIAXONE SODIUM 250 MG IJ SOLR
250.0000 mg | Freq: Once | INTRAMUSCULAR | Status: AC
  Administered 2016-11-06: 250 mg via INTRAMUSCULAR
  Filled 2016-11-06: qty 250

## 2016-11-06 NOTE — ED Notes (Signed)
Pt verbalizes understanding of d/c instructions and denies any further needs at this time. 

## 2016-11-07 LAB — GC/CHLAMYDIA PROBE AMP (~~LOC~~) NOT AT ARMC
Chlamydia: NEGATIVE
Neisseria Gonorrhea: NEGATIVE

## 2016-12-09 ENCOUNTER — Encounter (HOSPITAL_BASED_OUTPATIENT_CLINIC_OR_DEPARTMENT_OTHER): Payer: Self-pay | Admitting: *Deleted

## 2016-12-09 ENCOUNTER — Emergency Department (HOSPITAL_BASED_OUTPATIENT_CLINIC_OR_DEPARTMENT_OTHER): Payer: BC Managed Care – PPO

## 2016-12-09 ENCOUNTER — Emergency Department (HOSPITAL_BASED_OUTPATIENT_CLINIC_OR_DEPARTMENT_OTHER)
Admission: EM | Admit: 2016-12-09 | Discharge: 2016-12-10 | Disposition: A | Discharge status: Home / Self Care | Payer: BC Managed Care – PPO | Attending: Emergency Medicine | Admitting: Emergency Medicine

## 2016-12-09 DIAGNOSIS — M533 Sacrococcygeal disorders, not elsewhere classified: Secondary | ICD-10-CM | POA: Diagnosis not present

## 2016-12-09 DIAGNOSIS — M545 Low back pain: Secondary | ICD-10-CM | POA: Diagnosis present

## 2016-12-09 LAB — PREGNANCY, URINE: PREG TEST UR: NEGATIVE

## 2016-12-09 LAB — URINALYSIS, ROUTINE W REFLEX MICROSCOPIC
BILIRUBIN URINE: NEGATIVE
GLUCOSE, UA: NEGATIVE mg/dL
Hgb urine dipstick: NEGATIVE
KETONES UR: NEGATIVE mg/dL
Leukocytes, UA: NEGATIVE
Nitrite: NEGATIVE
PROTEIN: NEGATIVE mg/dL
Specific Gravity, Urine: 1.026 (ref 1.005–1.030)
pH: 6.5 (ref 5.0–8.0)

## 2016-12-09 MED ORDER — METHOCARBAMOL 500 MG PO TABS
750.0000 mg | ORAL_TABLET | Freq: Once | ORAL | Status: AC
  Administered 2016-12-10: 750 mg via ORAL
  Filled 2016-12-09: qty 2

## 2016-12-09 MED ORDER — KETOROLAC TROMETHAMINE 60 MG/2ML IM SOLN
30.0000 mg | Freq: Once | INTRAMUSCULAR | Status: AC
  Administered 2016-12-10: 30 mg via INTRAMUSCULAR
  Filled 2016-12-09: qty 2

## 2016-12-09 NOTE — ED Triage Notes (Signed)
Pt states she hit her lower back (coccyx) on the corner of a wall on Mother's Day. Pain has gotten worse over the past few days.

## 2016-12-09 NOTE — ED Provider Notes (Signed)
[facility]   CSN: [id number] Arrival date & time: 12/09/16  2221   By signing my name below, I, Soijett Blue, attest that this documentation has been prepared under the direction and in the presence of Jaynie Crumble, [employer] Electronically Signed: Soijett Blue, ED Scribe. 12/09/16. 11:46 PM.  History   Chief Complaint Chief Complaint  Patient presents with  . Back Pain    HPI Anna Dillon is a 21 y.o. female who presents to the Emergency Department complaining of gradually worsening, lower back pain onset 1 month ago. Pt has not tried any medications for the relief of her symptoms. She notes that she was being carried by her younger sibling and upon getting down, she struck her lower back on the corner of a wall approximately 1 month ago. Pt reports that she felt immediate pain to her lower back following the incident and were mildly alleviated shortly after. Pt notes that she is unsure of what exacerbated her lower back pain. Pt lower back pain is worsened with laying on her back and with movement. Denies fever, chills, color change, wound, recent injury, and any other symptoms.    The history is provided by the patient. No language interpreter was used.    History reviewed. No pertinent past medical history.  There are no active problems to display for this patient.   Past Surgical History:  Procedure Laterality Date  . TONSILLECTOMY  10/2015    OB History    No data available       Home Medications    Prior to Admission medications   Medication Sig Start Date End Date Taking? Authorizing Provider  doxycycline (VIBRAMYCIN) 100 MG capsule Take 1 capsule (100 mg total) by mouth 2 (two) times daily. 11/06/16   Everlene Farrier, PA-C  naproxen (NAPROSYN) 250 MG tablet Take 1 tablet (250 mg total) by mouth 2 (two) times daily with a meal. 11/06/16   Everlene Farrier, PA-C  ondansetron (ZOFRAN ODT) 4 MG disintegrating tablet Take 1 tablet (4 mg total)  by mouth every 8 (eight) hours as needed for nausea or vomiting. 11/06/16   Everlene Farrier, PA-C    Family History History reviewed. No pertinent family history.  Social History Social History  Substance Use Topics  . Smoking status: Never Smoker  . Smokeless tobacco: Never Used  . Alcohol use Yes     Comment: rarely     Allergies   Augmentin [amoxicillin-pot clavulanate]   Review of Systems Review of Systems  Constitutional: Negative for chills and fever.  Musculoskeletal: Positive for back pain (lower).  Skin: Negative for color change and wound.  Neurological: Negative for weakness and numbness.     Physical Exam Updated Vital Signs BP 122/78 (BP Location: Left Arm)   Pulse 75   Temp 99 F (37.2 C) (Oral)   Resp 16   Ht 5\' 7"  (1.702 m)   Wt 135 lb (61.2 kg)   SpO2 100%   BMI 21.14 kg/m   Physical Exam  Constitutional: She is oriented to person, place, and time. She appears well-developed and well-nourished. No distress.  HENT:  Head: Normocephalic and atraumatic.  Eyes: EOM are normal.  Neck: Neck supple.  Cardiovascular: Normal rate.   Pulmonary/Chest: Effort normal. No respiratory distress.  Abdominal: She exhibits no distension.  Musculoskeletal: Normal range of motion.  Tenderness to palpation with midline sacrum and upper gluteal cleft. There is no skin discoloration or erythema. There is no skin induration. There is no  palpable soft tissue swelling. Full range of motion of bilateral lower extremities. Pain with forward flexion. No tenderness over lumbar spine or SI joints bilaterally. No tenderness over coccyx.  Neurological: She is alert and oriented to person, place, and time.  Skin: Skin is warm and dry.  Psychiatric: She has a normal mood and affect. Her behavior is normal.  Nursing note and vitals reviewed.    ED Treatments / Results  DIAGNOSTIC STUDIES: Oxygen Saturation is 100% on RA, nl by my interpretation.    COORDINATION OF  CARE: 11:50 PM Discussed treatment plan with pt at bedside and pt agreed to plan.   Labs (all labs ordered are listed, but only abnormal results are displayed) Labs Reviewed  PREGNANCY, URINE  URINALYSIS, ROUTINE W REFLEX MICROSCOPIC    EKG  EKG Interpretation None       Radiology Dg Sacrum/coccyx  Result Date: 12/09/2016 CLINICAL DATA:  Status post fall into wall 3 weeks ago, with acute onset of sacral pain. Initial encounter. EXAM: SACRUM AND COCCYX - 2+ VIEW COMPARISON:  CT of the abdomen and pelvis performed 08/11/2016 FINDINGS: There is no evidence of fracture or dislocation. The sacrum and coccyx appear grossly intact. The sacroiliac joints are unremarkable. The visualized bowel gas pattern is unremarkable. A metallic piercing is noted overlying the umbilicus. IMPRESSION: No evidence of fracture or dislocation. Electronically Signed   By: Roanna RaiderJeffery  Chang M.D.   On: 12/09/2016 23:35    Procedures Procedures (including critical care time)  Medications Ordered in ED Medications - No data to display   Initial Impression / Assessment and Plan / ED Course  I have reviewed the triage vital signs and the nursing notes.  Pertinent labs & imaging results that were available during my care of the patient were reviewed by me and considered in my medical decision making (see chart for details).   patient with pain to the lower sacrum and upper coccyx, injury one month ago which improved and started again one day ago. On exam, patient has tenderness to the upper gluteal cleft. She does have history of pilonidal cyst that was drained, however at this time there is no skin changes, no palpable induration or fluctuance to suggest another pilonidal abscess. Patient is also reporting injury to that area one month ago which improved but now came back, but denies any recurrent injuries. She does have pain with sitting and laying on that area as well as walking and movement. Differential includes  musculoskeletal pain versus developing pilonidal cyst. Old start her on NSAIDs, instructed to try warm soaks or heating pads. Will add Robaxin. Stretches. Follow-up as needed. Return if any skin discoloration, induration, warmth to the touch, swelling to the area.   Vitals:   12/09/16 2235  BP: 122/78  Pulse: 75  Resp: 16  Temp: 99 F (37.2 C)  TempSrc: Oral  SpO2: 100%  Weight: 61.2 kg (135 lb)  Height: 5\' 7"  (1.702 m)    Final Clinical Impressions(s) / ED Diagnoses   Final diagnoses:  Coccyx pain    New Prescriptions New Prescriptions   METHOCARBAMOL (ROBAXIN) 500 MG TABLET    Take 1 tablet (500 mg total) by mouth 2 (two) times daily.   NAPROXEN (NAPROSYN) 500 MG TABLET    Take 1 tablet (500 mg total) by mouth 2 (two) times daily.   I personally performed the services described in this documentation, which was scribed in my presence. The recorded information has been reviewed and is accurate.  Jaynie Crumble, PA-C 12/10/16 0007    Maia Plan, MD 12/10/16 1036

## 2016-12-10 DIAGNOSIS — M533 Sacrococcygeal disorders, not elsewhere classified: Secondary | ICD-10-CM | POA: Diagnosis not present

## 2016-12-10 MED ORDER — NAPROXEN 500 MG PO TABS: 500.0000 mg | ORAL_TABLET | Freq: Two times a day (BID) | ORAL | 0 refills | Status: DC

## 2016-12-10 MED ORDER — METHOCARBAMOL 500 MG PO TABS: 500.0000 mg | ORAL_TABLET | Freq: Two times a day (BID) | ORAL | 0 refills | Status: DC

## 2016-12-10 NOTE — Discharge Instructions (Signed)
Your x-ray today was normal. I'm suspicious that he may be developing a pilonidal abscess. Warm soaks several times a day. Keep an eye on the area, if becomes red, more swollen, please follow-up on return to emergency department for drainage. Otherwise try heating pads, and take ibuprofen or naproxen for pain. Take Tylenol for additional pain relief. Take robaxin for muscle spasms. Try heating pads. Stretches. Follow up with family doctor if not improving.

## 2017-02-19 ENCOUNTER — Encounter (HOSPITAL_BASED_OUTPATIENT_CLINIC_OR_DEPARTMENT_OTHER): Payer: Self-pay | Admitting: Emergency Medicine

## 2017-02-19 ENCOUNTER — Emergency Department (HOSPITAL_BASED_OUTPATIENT_CLINIC_OR_DEPARTMENT_OTHER)
Admission: EM | Admit: 2017-02-19 | Discharge: 2017-02-19 | Disposition: A | Discharge status: Home / Self Care | Payer: BC Managed Care – PPO | Attending: Emergency Medicine | Admitting: Emergency Medicine

## 2017-02-19 DIAGNOSIS — G43009 Migraine without aura, not intractable, without status migrainosus: Secondary | ICD-10-CM | POA: Diagnosis not present

## 2017-02-19 DIAGNOSIS — Z79899 Other long term (current) drug therapy: Secondary | ICD-10-CM | POA: Diagnosis not present

## 2017-02-19 DIAGNOSIS — R51 Headache: Secondary | ICD-10-CM | POA: Diagnosis present

## 2017-02-19 HISTORY — DX: Migraine, unspecified, not intractable, without status migrainosus: G43.909

## 2017-02-19 LAB — PREGNANCY, URINE: PREG TEST UR: NEGATIVE

## 2017-02-19 MED ORDER — DIPHENHYDRAMINE HCL 12.5 MG/5ML PO ELIX
12.5000 mg | ORAL_SOLUTION | Freq: Once | ORAL | Status: AC
  Administered 2017-02-19: 12.5 mg via ORAL
  Filled 2017-02-19: qty 10

## 2017-02-19 MED ORDER — KETOROLAC TROMETHAMINE 60 MG/2ML IM SOLN
60.0000 mg | Freq: Once | INTRAMUSCULAR | Status: AC
  Administered 2017-02-19: 60 mg via INTRAMUSCULAR
  Filled 2017-02-19: qty 2

## 2017-02-19 MED ORDER — METOCLOPRAMIDE HCL 5 MG/ML IJ SOLN
10.0000 mg | Freq: Once | INTRAMUSCULAR | Status: AC
  Administered 2017-02-19: 10 mg via INTRAMUSCULAR
  Filled 2017-02-19: qty 2

## 2017-02-19 NOTE — ED Triage Notes (Signed)
Pt with HA x 6 hours denies N/V or fevers

## 2017-02-19 NOTE — ED Provider Notes (Signed)
[facility]   CSN: [id number] Arrival date & time: 02/19/17  0524     History   Chief Complaint Chief Complaint  Patient presents with  . Headache    HPI Anna Dillon is a 21 y.o. female.  The history is provided by the patient.  Migraine  This is a recurrent problem. The current episode started 6 to 12 hours ago. The problem occurs constantly. The problem has not changed since onset.Pertinent negatives include no chest pain, no abdominal pain and no shortness of breath. Nothing aggravates the symptoms. Nothing relieves the symptoms. She has tried acetaminophen (one pill) for the symptoms. The treatment provided no relief.  Patient with a h/o migraine (left sided) who was most recently seen at [facility] for same on 01/26/17 but did not fill medication presents with a recurrence.  She has no triggers for her HA.  She denies f/c/r.  No changes in cognition or speech, no recent infections.  No weakness or numbness, no swelling of the face, no stiffness of the neck.  Patient is supposed to wear corrective lenses but does not.    History reviewed. No pertinent past medical history.  There are no active problems to display for this patient.   Past Surgical History:  Procedure Laterality Date  . TONSILLECTOMY  10/2015    OB History    No data available       Home Medications    Prior to Admission medications   Medication Sig Start Date End Date Taking? Authorizing Provider  doxycycline (VIBRAMYCIN) 100 MG capsule Take 1 capsule (100 mg total) by mouth 2 (two) times daily. 11/06/16   Everlene Farrier, PA-C  methocarbamol (ROBAXIN) 500 MG tablet Take 1 tablet (500 mg total) by mouth 2 (two) times daily. 12/10/16   Kirichenko, Tatyana, PA-C  naproxen (NAPROSYN) 500 MG tablet Take 1 tablet (500 mg total) by mouth 2 (two) times daily. 12/10/16   Kirichenko, Tatyana, PA-C  ondansetron (ZOFRAN ODT) 4 MG disintegrating tablet Take 1 tablet (4 mg total) by mouth every 8  (eight) hours as needed for nausea or vomiting. 11/06/16   Everlene Farrier, PA-C    Family History No family history on file.  Social History Social History  Substance Use Topics  . Smoking status: Never Smoker  . Smokeless tobacco: Never Used  . Alcohol use Yes     Comment: rarely     Allergies   Augmentin [amoxicillin-pot clavulanate]   Review of Systems Review of Systems  Constitutional: Negative for fever.  HENT: Negative for facial swelling, rhinorrhea, sore throat, trouble swallowing and voice change.   Eyes: Negative for photophobia and visual disturbance.  Respiratory: Negative for cough and shortness of breath.   Cardiovascular: Negative for chest pain.  Gastrointestinal: Negative for abdominal pain, nausea and vomiting.  Musculoskeletal: Negative for neck pain and neck stiffness.  Neurological: Negative for dizziness, tremors, seizures, syncope, facial asymmetry, speech difficulty, weakness, light-headedness and numbness.  All other systems reviewed and are negative.    Physical Exam Updated Vital Signs There were no vitals taken for this visit.  Physical Exam  Constitutional: She is oriented to person, place, and time. She appears well-developed and well-nourished. No distress.  Sitting comfortably in the room with all the lights on  HENT:  Head: Normocephalic and atraumatic.  Nose: Nose normal.  Mouth/Throat: Oropharynx is clear and moist. No oropharyngeal exudate.  Eyes: Pupils are equal, round, and reactive to light. Conjunctivae and EOM are normal.  No  proptosis, disk margins sharp  Neck: Normal range of motion. Neck supple. No spinous process tenderness present. No neck rigidity. Normal range of motion present.  Cardiovascular: Normal rate, regular rhythm, normal heart sounds and intact distal pulses.   Pulmonary/Chest: Effort normal and breath sounds normal. No stridor. She has no wheezes. She has no rales.  Abdominal: Soft. Bowel sounds are normal.  She exhibits no mass. There is no tenderness. There is no rebound and no guarding.  Musculoskeletal: Normal range of motion.  Lymphadenopathy:    She has no cervical adenopathy.  Neurological: She is alert and oriented to person, place, and time. She displays normal reflexes. No cranial nerve deficit.  Skin: Skin is warm and dry. Capillary refill takes less than 2 seconds.  Psychiatric: She has a normal mood and affect.     ED Treatments / Results  Labs (all labs ordered are listed, but only abnormal results are displayed)  Radiology No results found.  Procedures Procedures (including critical care time)  Medications Ordered in ED Medications  ketorolac (TORADOL) injection 60 mg (not administered)  metoCLOPramide (REGLAN) injection 10 mg (not administered)  diphenhydrAMINE (BENADRYL) 12.5 MG/5ML elixir 12.5 mg (not administered)      Visual Acuity  Right Eye Distance: 20/25 Left Eye Distance: 20/25 Bilateral Distance: 20/20  Right Eye Near:   Left Eye Near:    Bilateral Near:      I see not signs of intracranial infection, meningitis, cavernous sinus thrombosis, no ICH.   Final Clinical Impressions(s) / ED Diagnoses    The patient is very well appearing and has been observed in the ED.  Strict return precautions given for  intractable rash, swelling or the lips tongue or floor of the mouth, chest pain, dyspnea on exertion, new weakness or numbness changes in vision or speech, Inability to tolerate liquids or food, fevers > 101, rashes on the skin, altered mental status or any concerns. No signs of systemic illness or infection. The patient is nontoxic-appearing on exam and vital signs are within normal limits.    I have reviewed the triage vital signs and the nursing notes. Pertinent labs &imaging results that were available during my care of the patient were reviewed by me and considered in my medical decision making (see chart for details).  After history, exam, and  medical workup I feel the patient has been appropriately medically screened and is safe for discharge home. Pertinent diagnoses were discussed with the patient. Patient was given return precautions.    Cordale Manera, MD 02/19/17 [id number]

## 2017-10-26 ENCOUNTER — Other Ambulatory Visit: Payer: Self-pay

## 2017-10-26 ENCOUNTER — Encounter (HOSPITAL_BASED_OUTPATIENT_CLINIC_OR_DEPARTMENT_OTHER): Payer: Self-pay | Admitting: Emergency Medicine

## 2017-10-26 ENCOUNTER — Emergency Department (HOSPITAL_BASED_OUTPATIENT_CLINIC_OR_DEPARTMENT_OTHER)
Admission: EM | Admit: 2017-10-26 | Discharge: 2017-10-26 | Disposition: A | Discharge status: Home / Self Care | Attending: Emergency Medicine | Admitting: Emergency Medicine

## 2017-10-26 DIAGNOSIS — R21 Rash and other nonspecific skin eruption: Secondary | ICD-10-CM

## 2017-10-26 LAB — COMPREHENSIVE METABOLIC PANEL
ALT: 12 U/L — ABNORMAL LOW (ref 14–54)
ANION GAP: 9 (ref 5–15)
AST: 20 U/L (ref 15–41)
Albumin: 4.3 g/dL (ref 3.5–5.0)
Alkaline Phosphatase: 44 U/L (ref 38–126)
BUN: 8 mg/dL (ref 6–20)
CHLORIDE: 110 mmol/L (ref 101–111)
CO2: 21 mmol/L — ABNORMAL LOW (ref 22–32)
Calcium: 8.9 mg/dL (ref 8.9–10.3)
Creatinine, Ser: 0.66 mg/dL (ref 0.44–1.00)
GFR calc non Af Amer: >60 mL/min (ref >60)
Glucose, Bld: 98 mg/dL (ref 65–99)
POTASSIUM: 3.5 mmol/L (ref 3.5–5.1)
Sodium: 140 mmol/L (ref 135–145)
Total Bilirubin: 1.3 mg/dL — ABNORMAL HIGH (ref 0.3–1.2)
Total Protein: 7 g/dL (ref 6.5–8.1)

## 2017-10-26 LAB — CBC WITH DIFFERENTIAL/PLATELET
BASOS ABS: 0 10*3/uL (ref 0.0–0.1)
Basophils Relative: 0 %
EOS PCT: 5 %
Eosinophils Absolute: 0.3 10*3/uL (ref 0.0–0.7)
HCT: 38.7 % (ref 36.0–46.0)
Hemoglobin: 13.7 g/dL (ref 12.0–15.0)
LYMPHS PCT: 38 %
Lymphs Abs: 2.7 10*3/uL (ref 0.7–4.0)
MCH: 30.6 pg (ref 26.0–34.0)
MCHC: 35.4 g/dL (ref 30.0–36.0)
MCV: 86.6 fL (ref 78.0–100.0)
MONO ABS: 0.3 10*3/uL (ref 0.1–1.0)
Monocytes Relative: 5 %
Neutro Abs: 3.6 10*3/uL (ref 1.7–7.7)
Neutrophils Relative %: 52 %
PLATELETS: 223 10*3/uL (ref 150–400)
RBC: 4.47 MIL/uL (ref 3.87–5.11)
RDW: 12.2 % (ref 11.5–15.5)
WBC: 6.9 10*3/uL (ref 4.0–10.5)

## 2017-10-26 LAB — SEDIMENTATION RATE: SED RATE: 1 mm/h (ref 0–22)

## 2017-10-26 MED ORDER — DIPHENHYDRAMINE HCL 25 MG PO TABS: 25.0000 mg | ORAL_TABLET | Freq: Four times a day (QID) | ORAL | 0 refills | Status: DC

## 2017-10-26 MED ORDER — HYDROCORTISONE 2.5 % EX OINT: TOPICAL_OINTMENT | Freq: Two times a day (BID) | CUTANEOUS | 0 refills | Status: DC

## 2017-10-26 MED ORDER — HYDROCORTISONE 1 % EX CREA
TOPICAL_CREAM | Freq: Two times a day (BID) | CUTANEOUS | Status: DC
  Administered 2017-10-26: 1 via TOPICAL
  Filled 2017-10-26: qty 28

## 2017-10-26 MED ORDER — DIPHENHYDRAMINE HCL 25 MG PO CAPS
25.0000 mg | ORAL_CAPSULE | Freq: Once | ORAL | Status: AC
  Administered 2017-10-26: 25 mg via ORAL
  Filled 2017-10-26: qty 1

## 2017-10-26 NOTE — ED Triage Notes (Signed)
Patient states that she has had a rash that keeps popping up in different areas. States that it burns and itches

## 2017-10-26 NOTE — ED Provider Notes (Addendum)
[place] EMERGENCY DEPARTMENT Provider Note   CSN: [id number] Arrival date & time: 10/26/17  1302     History   Chief Complaint Chief Complaint  Patient presents with  . Rash    HPI Anna Dillon is a 22 y.o. female otherwise healthy here with rash. Patient went to family's house about a week ago and thought she had bed bugs. She went to urgent care and just finished a course of prednisone yesterday. Since yesterday, she noticed some rash on bilateral knees and left forearm. States that they are itchy. Denies fevers or chills. Denies tick bite or exposure to poison IVY or poison oaks. Denies new shampoos or foods. States that none of her family has a rash. She also saw health department yesterday and was prescribed doxycycline and took a dose and the rash improved.   The history is provided by the spouse and the patient.    Past Medical History:  Diagnosis Date  . Migraine     There are no active problems to display for this patient.   Past Surgical History:  Procedure Laterality Date  . TONSILLECTOMY  10/2015     OB History   None      Home Medications    Prior to Admission medications   Medication Sig Start Date End Date Taking? Authorizing Provider  acetaminophen (TYLENOL) 500 MG tablet Take 500 mg by mouth every 6 (six) hours as needed.    [provider]  diphenhydrAMINE (BENADRYL) 25 MG tablet Take 1 tablet (25 mg total) by mouth every 6 (six) hours. 10/26/17   Drenda Freeze, MD  hydrocortisone 2.5 % ointment Apply topically 2 (two) times daily. 10/26/17   Drenda Freeze, MD    Family History History reviewed. No pertinent family history.  Social History Social History   Tobacco Use  . Smoking status: Never Smoker  . Smokeless tobacco: Never Used  Substance Use Topics  . Alcohol use: Yes    Comment: rarely  . Drug use: No     Allergies   Augmentin [amoxicillin-pot clavulanate]   Review of Systems Review of  Systems  Skin: Positive for rash.  All other systems reviewed and are negative.    Physical Exam Updated Vital Signs BP 120/73 (BP Location: Right Arm)   Pulse 80   Temp 98.2 F (36.8 C) (Oral)   Resp 18   Ht 5' 7.5" (1.715 m)   Wt 68 kg (150 lb)   LMP 10/07/2017   SpO2 100%   BMI 23.15 kg/m   Physical Exam  Constitutional: She is oriented to person, place, and time. She appears well-developed and well-nourished.  HENT:  Head: Normocephalic.  Mouth/Throat: Oropharynx is clear and moist.  No rash on face or OP   Eyes: Pupils are equal, round, and reactive to light. Conjunctivae and EOM are normal.  Neck: Normal range of motion. Neck supple.  Cardiovascular: Normal rate, regular rhythm and normal heart sounds.  Pulmonary/Chest: Effort normal and breath sounds normal. No stridor. No respiratory distress. She has no wheezes.  Abdominal: Soft. Bowel sounds are normal. She exhibits no distension.  Musculoskeletal: Normal range of motion.  Neurological: She is alert and oriented to person, place, and time.  Skin: Skin is warm.  Erythema proximal tibia, slightly warm. Small erythema L forearm. There are some scabs on the L chest. No lesions on webspaces. No vesicles, no petechiae or purpura   Psychiatric: She has a normal mood and affect.  Nursing note  and vitals reviewed.    ED Treatments / Results  Labs (all labs ordered are listed, but only abnormal results are displayed) Labs Reviewed  COMPREHENSIVE METABOLIC PANEL - Abnormal; Notable for the following components:      Result Value   CO2 21 (*)    ALT 12 (*)    Total Bilirubin 1.3 (*)    All other components within normal limits  CBC WITH DIFFERENTIAL/PLATELET  SEDIMENTATION RATE    EKG None  Radiology No results found.  Procedures Procedures (including critical care time)  Medications Ordered in ED Medications  hydrocortisone cream 1 % (1 application Topical Given 10/26/17 1344)  diphenhydrAMINE  (BENADRYL) capsule 25 mg (25 mg Oral Given 10/26/17 1343)     Initial Impression / Assessment and Plan / ED Course  I have reviewed the triage vital signs and the nursing notes.  Pertinent labs & imaging results that were available during my care of the patient were reviewed by me and considered in my medical decision making (see chart for details).     Anna Dillon is a 22 y.o. female here with rash. Started on the chest and improved with prednisone. Now has it on the shins and L forearm. I considered erythema nodosum but its not localized just in the legs. No petechiae or purpura. No signs of scabies. Given that this is her third visit, I obtained labs and her CBC is normal. ESR is normal. I doubt any vasculitis or erythema nodosum at this point. She is on doxycycline and rash has improved so she can continue taking that. Will give benadryl PO for itchiness, hydrocortisone as needed.   Final Clinical Impressions(s) / ED Diagnoses   Final diagnoses:  Rash and nonspecific skin eruption    ED Discharge Orders        Ordered    diphenhydrAMINE (BENADRYL) 25 MG tablet  Every 6 hours     10/26/17 1334    hydrocortisone 2.5 % ointment  2 times daily     10/26/17 1334       Drenda Freeze, MD 10/26/17 1332    Drenda Freeze, MD 10/26/17 1524    Drenda Freeze, MD 10/26/17 1525

## 2017-10-26 NOTE — Discharge Instructions (Addendum)
Continue taking doxycycline.  Take benadryl 25 mg every 6 hrs for itchiness.   Apply hydrocortisone cream as needed for itchiness.   See your doctor  Return to ER if you have worse rash, fever, oral lesions.

## 2017-10-28 ENCOUNTER — Encounter (HOSPITAL_BASED_OUTPATIENT_CLINIC_OR_DEPARTMENT_OTHER): Payer: Self-pay

## 2017-10-28 ENCOUNTER — Other Ambulatory Visit: Payer: Self-pay

## 2017-10-28 ENCOUNTER — Emergency Department (HOSPITAL_BASED_OUTPATIENT_CLINIC_OR_DEPARTMENT_OTHER)
Admission: EM | Admit: 2017-10-28 | Discharge: 2017-10-29 | Disposition: A | Discharge status: Home / Self Care | Attending: Emergency Medicine | Admitting: Emergency Medicine

## 2017-10-28 ENCOUNTER — Emergency Department (HOSPITAL_BASED_OUTPATIENT_CLINIC_OR_DEPARTMENT_OTHER)

## 2017-10-28 DIAGNOSIS — G43009 Migraine without aura, not intractable, without status migrainosus: Secondary | ICD-10-CM

## 2017-10-28 DIAGNOSIS — R21 Rash and other nonspecific skin eruption: Secondary | ICD-10-CM | POA: Insufficient documentation

## 2017-10-28 DIAGNOSIS — R079 Chest pain, unspecified: Secondary | ICD-10-CM | POA: Diagnosis present

## 2017-10-28 LAB — PREGNANCY, URINE: Preg Test, Ur: NEGATIVE

## 2017-10-28 MED ORDER — NAPROXEN 250 MG PO TABS
500.0000 mg | ORAL_TABLET | Freq: Once | ORAL | Status: AC
  Administered 2017-10-29: 500 mg via ORAL
  Filled 2017-10-28: qty 2

## 2017-10-28 MED ORDER — GI COCKTAIL ~~LOC~~
30.0000 mL | Freq: Once | ORAL | Status: AC
  Administered 2017-10-29: 30 mL via ORAL
  Filled 2017-10-28: qty 30

## 2017-10-28 MED ORDER — DIPHENHYDRAMINE HCL 25 MG PO CAPS
25.0000 mg | ORAL_CAPSULE | Freq: Once | ORAL | Status: AC
  Administered 2017-10-29: 25 mg via ORAL
  Filled 2017-10-28: qty 1

## 2017-10-28 NOTE — ED Triage Notes (Signed)
C/o CP x 20 min-NAD-steady gait 

## 2017-10-29 ENCOUNTER — Encounter (HOSPITAL_BASED_OUTPATIENT_CLINIC_OR_DEPARTMENT_OTHER): Payer: Self-pay | Admitting: Emergency Medicine

## 2017-10-29 MED ORDER — IBUPROFEN 800 MG PO TABS: 800.0000 mg | ORAL_TABLET | Freq: Three times a day (TID) | ORAL | 0 refills | Status: DC

## 2017-10-29 MED ORDER — DEXAMETHASONE SODIUM PHOSPHATE 10 MG/ML IJ SOLN
10.0000 mg | Freq: Once | INTRAMUSCULAR | Status: AC
  Administered 2017-10-29: 10 mg via INTRAMUSCULAR
  Filled 2017-10-29: qty 1

## 2017-10-29 MED ORDER — FAMOTIDINE 20 MG PO TABS: 20.0000 mg | ORAL_TABLET | Freq: Two times a day (BID) | ORAL | 0 refills | Status: DC

## 2017-10-29 NOTE — ED Provider Notes (Signed)
[place] EMERGENCY DEPARTMENT Provider Note   CSN: [id number] Arrival date & time: 10/28/17  2126     History   Chief Complaint Chief Complaint  Patient presents with  . Chest Pain    HPI Anna Dillon is a 22 y.o. female.  The history is provided by the patient and a friend.  Chest Pain   This is a new problem. The current episode started less than 1 hour ago. The problem occurs constantly. The problem has not changed since onset.The pain is associated with an emotional upset (as she was driving her regarding her ongoing rash and migraine). The pain is present in the substernal region. The pain is moderate. The quality of the pain is described as dull. The pain does not radiate. Associated symptoms include headaches. Pertinent negatives include no abdominal pain, no back pain, no claudication, no cough, no diaphoresis, no dizziness, no exertional chest pressure, no fever, no hemoptysis, no irregular heartbeat, no leg pain, no lower extremity edema, no malaise/fatigue, no nausea, no near-syncope, no numbness, no orthopnea, no palpitations, no PND, no shortness of breath, no sputum production, no syncope, no vomiting and no weakness. She has tried nothing for the symptoms. The treatment provided no relief. Risk factors: none.  Pertinent negatives for past medical history include Turner syndrome.  Pertinent negatives for family medical history include: no hypertension and no TIA.  Procedure history is negative for cardiac catheterization.  No long car trips or plane trips.  Not had any contraception recently.  No leg pain or swelling. Symptoms started in car on way here for persistent skin lesions and recurrent migraine.  No proptosis.  No changes in vision speech or cognition.  No weakness no numbness.No f/c/r.    Past Medical History:  Diagnosis Date  . Migraine     There are no active problems to display for this patient.   Past Surgical History:  Procedure Laterality  Date  . TONSILLECTOMY  10/2015     OB History   None      Home Medications    Prior to Admission medications   Medication Sig Start Date End Date Taking? Authorizing Provider  acetaminophen (TYLENOL) 500 MG tablet Take 500 mg by mouth every 6 (six) hours as needed.    [provider]  diphenhydrAMINE (BENADRYL) 25 MG tablet Take 1 tablet (25 mg total) by mouth every 6 (six) hours. 10/26/17   Drenda Freeze, MD  famotidine (PEPCID) 20 MG tablet Take 1 tablet (20 mg total) by mouth 2 (two) times daily. 10/29/17   Jari Dipasquale, MD  hydrocortisone 2.5 % ointment Apply topically 2 (two) times daily. 10/26/17   Drenda Freeze, MD  ibuprofen (ADVIL,MOTRIN) 800 MG tablet Take 1 tablet (800 mg total) by mouth 3 (three) times daily. 10/29/17   Shantele Reller, MD    Family History No family history on file.  Social History Social History   Tobacco Use  . Smoking status: Never Smoker  . Smokeless tobacco: Never Used  Substance Use Topics  . Alcohol use: Not Currently  . Drug use: No     Allergies   Augmentin [amoxicillin-pot clavulanate]   Review of Systems Review of Systems  Constitutional: Negative for diaphoresis, fever and malaise/fatigue.  Respiratory: Negative for cough, hemoptysis, sputum production and shortness of breath.   Cardiovascular: Positive for chest pain. Negative for palpitations, orthopnea, claudication, leg swelling, syncope, PND and near-syncope.  Gastrointestinal: Negative for abdominal pain, nausea and vomiting.  Genitourinary: Negative  for flank pain.  Musculoskeletal: Negative for back pain.  Neurological: Positive for headaches. Negative for dizziness, facial asymmetry, weakness, light-headedness and numbness.  All other systems reviewed and are negative.    Physical Exam Updated Vital Signs BP (!) 131/98 (BP Location: Right Arm)   Pulse 90   Temp 98.3 F (36.8 C)   Resp 18   Ht _0  (1.702 m)   Wt 69.9 kg (154 lb 1.6 oz)    LMP 10/07/2017   SpO2 100%   BMI 24.14 kg/m   Physical Exam  Constitutional: She is oriented to person, place, and time. She appears well-developed and well-nourished. No distress.  HENT:  Head: Normocephalic and atraumatic.  Right Ear: External ear normal.  Left Ear: External ear normal.  Nose: Nose normal.  Mouth/Throat: Oropharynx is clear and moist. No oropharyngeal exudate.  Eyes: Pupils are equal, round, and reactive to light. Conjunctivae and EOM are normal.  No proptosis intact cognition  Neck: Normal range of motion. Neck supple. No JVD present. No tracheal deviation present. No thyromegaly present.  Cardiovascular: Normal rate, regular rhythm, normal heart sounds and intact distal pulses.  Pulmonary/Chest: Effort normal and breath sounds normal. No stridor. No respiratory distress. She has no wheezes. She has no rales. She exhibits no tenderness.  Abdominal: Soft. Bowel sounds are normal. She exhibits no mass. There is no tenderness. There is no rebound and no guarding.  Musculoskeletal: Normal range of motion. She exhibits no edema, tenderness or deformity.  Lymphadenopathy:    She has no cervical adenopathy.  Neurological: She is alert and oriented to person, place, and time. She displays normal reflexes. No cranial nerve deficit or sensory deficit. She exhibits normal muscle tone. Coordination normal.  Skin: Skin is warm and dry. Capillary refill takes less than 2 seconds.  2 cm plaques well circumscribe one proximal to right patella, other proximal to left wrist volar aspect they are not tender to palpation they do no blanch  Psychiatric: She has a normal mood and affect.  Nursing note and vitals reviewed.    ED Treatments / Results   EKG EKG Interpretation  Date/Time:  Monday Raseel Jans 29 2019 21:32:26 EDT Ventricular Rate:  93 PR Interval:  150 QRS Duration: 82 QT Interval:  346 QTC Calculation: 430 R Axis:   72 Text Interpretation:  Normal sinus rhythm Confirmed  by Randal Buba, Jakera Beaupre ([id number]) on 10/28/2017 11:17:20 PM   Radiology Dg Chest 2 View  Result Date: 10/29/2017 CLINICAL DATA:  Right upper chest pain for 30 minutes. EXAM: CHEST - 2 VIEW COMPARISON:  08/09/2016 FINDINGS: Hyperinflation. The heart size and mediastinal contours are within normal limits. Both lungs are clear. The visualized skeletal structures are unremarkable. IMPRESSION: No active cardiopulmonary disease. Electronically Signed   By: Lucienne Capers M.D.   On: 10/29/2017 00:01    Procedures Procedures (including critical care time)  Medications Ordered in ED Medications  naproxen (NAPROSYN) tablet 500 mg (500 mg Oral Given 10/29/17 0003)  gi cocktail (Maalox,Lidocaine,Donnatal) (30 mLs Oral Given 10/29/17 0004)  diphenhydrAMINE (BENADRYL) capsule 25 mg (25 mg Oral Given 10/29/17 0003)  dexamethasone (DECADRON) injection 10 mg (10 mg Intramuscular Given 10/29/17 0051)       Final Clinical Impressions(s) / ED Diagnoses   Final diagnoses:  Migraine without aura and without status migrainosus, not intractable   Pain I suspect is a combination or GERD and anxiety as it happened after eating on the way in here for a myriad of other complaints.  I  do not feel the patient has signs of ICH, intracranial infection, or dural sinus thrombosis.  I suspect the skin lesions are e nodosum but there is not signs of systemic illness to explain them.  Will start NSAIDs and refer to PMD and allergy/immunology.  Follow up with your headache specialist.  PERC negative wells 0 highly doubt PE in this low risk patient.    Return for weakness, numbness, changes in vision or speech, fevers >100.4 unrelieved by medication, shortness of breath, intractable vomiting, or diarrhea, abdominal pain, Inability to tolerate liquids or food, cough, altered mental status or any concerns. No signs of systemic illness or infection. The patient is nontoxic-appearing on exam and vital signs are within normal limits.    I have reviewed the triage vital signs and the nursing notes. Pertinent labs &imaging results that were available during my care of the patient were reviewed by me and considered in my medical decision making (see chart for details).  After history, exam, and medical workup I feel the patient has been appropriately medically screened and is safe for discharge home. Pertinent diagnoses were discussed with the patient. Patient was given return precautions. ED Discharge Orders        Ordered    famotidine (PEPCID) 20 MG tablet  2 times daily     10/29/17 0055    ibuprofen (ADVIL,MOTRIN) 800 MG tablet  3 times daily     10/29/17 0055       Iowa Kappes, MD 10/29/17 [id number]

## 2017-10-29 NOTE — ED Notes (Signed)
Chest pain is resolved

## 2018-04-08 ENCOUNTER — Encounter (HOSPITAL_BASED_OUTPATIENT_CLINIC_OR_DEPARTMENT_OTHER): Payer: Self-pay | Admitting: *Deleted

## 2018-04-08 ENCOUNTER — Emergency Department (HOSPITAL_BASED_OUTPATIENT_CLINIC_OR_DEPARTMENT_OTHER)

## 2018-04-08 ENCOUNTER — Other Ambulatory Visit: Payer: Self-pay

## 2018-04-08 ENCOUNTER — Emergency Department (HOSPITAL_BASED_OUTPATIENT_CLINIC_OR_DEPARTMENT_OTHER)
Admission: EM | Admit: 2018-04-08 | Discharge: 2018-04-08 | Disposition: A | Discharge status: Home / Self Care | Attending: Emergency Medicine | Admitting: Emergency Medicine

## 2018-04-08 DIAGNOSIS — R51 Headache: Secondary | ICD-10-CM | POA: Insufficient documentation

## 2018-04-08 DIAGNOSIS — R519 Headache, unspecified: Secondary | ICD-10-CM

## 2018-04-08 LAB — PREGNANCY, URINE: Preg Test, Ur: NEGATIVE

## 2018-04-08 MED ORDER — DIPHENHYDRAMINE HCL 25 MG PO CAPS
25.0000 mg | ORAL_CAPSULE | Freq: Once | ORAL | Status: AC
  Administered 2018-04-08: 25 mg via ORAL
  Filled 2018-04-08: qty 1

## 2018-04-08 MED ORDER — ONDANSETRON HCL 4 MG/2ML IJ SOLN
4.0000 mg | Freq: Once | INTRAMUSCULAR | Status: AC
  Administered 2018-04-08: 4 mg via INTRAVENOUS
  Filled 2018-04-08: qty 2

## 2018-04-08 MED ORDER — KETOROLAC TROMETHAMINE 15 MG/ML IJ SOLN
15.0000 mg | Freq: Once | INTRAMUSCULAR | Status: AC
  Administered 2018-04-08: 15 mg via INTRAVENOUS
  Filled 2018-04-08: qty 1

## 2018-04-08 MED ORDER — DEXAMETHASONE SODIUM PHOSPHATE 10 MG/ML IJ SOLN
10.0000 mg | Freq: Once | INTRAMUSCULAR | Status: AC
  Administered 2018-04-08: 10 mg via INTRAVENOUS
  Filled 2018-04-08: qty 1

## 2018-04-08 MED ORDER — SODIUM CHLORIDE 0.9 % IV BOLUS
1000.0000 mL | Freq: Once | INTRAVENOUS | Status: AC
  Administered 2018-04-08: 1000 mL via INTRAVENOUS

## 2018-04-08 NOTE — Discharge Instructions (Addendum)
Home to rest. Follow up with PCP within the next week, referral given if needed. Return to Er for any worsening or concerning symptoms.

## 2018-04-08 NOTE — ED Notes (Signed)
ED Provider at bedside. 

## 2018-04-08 NOTE — ED Provider Notes (Signed)
[facility] EMERGENCY DEPARTMENT Provider Note   CSN: [id number] Arrival date & time: 04/08/18  1518     History   Chief Complaint Chief Complaint  Patient presents with  . Headache    HPI Anna Dillon is a 22 y.o. female.  22 year old female presents with complaint of headache.  Patient states pain started in the right occipital area 4 days ago, is constant, worse with sneezing and bending over.  Described as dull and throbbing.  Reports revision with onset of headache 4 days ago as well as briefly today after class.  Reports nausea without vomiting.  Denies fevers, chills, neck pain or stiffness, recent falls or injuries, changes in gait, speech, leg or arm weakness.  Denies any history of aneurysm.  She has taken Tylenol and meloxicam without relief of her pain.  Patient has a history of migraines, states this does not feel like her typical migraine by location or pattern.  No other complaints or concerns.     Past Medical History:  Diagnosis Date  . Migraine     There are no active problems to display for this patient.   Past Surgical History:  Procedure Laterality Date  . TONSILLECTOMY  10/2015     OB History   None      Home Medications    Prior to Admission medications   Medication Sig Start Date End Date Taking? Authorizing Provider  acetaminophen (TYLENOL) 500 MG tablet Take 500 mg by mouth every 6 (six) hours as needed.   Yes [provider]  diphenhydrAMINE (BENADRYL) 25 MG tablet Take 1 tablet (25 mg total) by mouth every 6 (six) hours. 10/26/17  Yes Charlynne Pander, MD  famotidine (PEPCID) 20 MG tablet Take 1 tablet (20 mg total) by mouth 2 (two) times daily. 10/29/17   Palumbo, April, MD  hydrocortisone 2.5 % ointment Apply topically 2 (two) times daily. 10/26/17   Charlynne Pander, MD  ibuprofen (ADVIL,MOTRIN) 800 MG tablet Take 1 tablet (800 mg total) by mouth 3 (three) times daily. 10/29/17   Palumbo, April, MD    Family  History History reviewed. No pertinent family history.  Social History Social History   Tobacco Use  . Smoking status: Never Smoker  . Smokeless tobacco: Never Used  Substance Use Topics  . Alcohol use: Not Currently  . Drug use: No     Allergies   Augmentin [amoxicillin-pot clavulanate]   Review of Systems Review of Systems  Constitutional: Negative for chills and fever.  Eyes: Positive for visual disturbance.  Gastrointestinal: Positive for nausea. Negative for abdominal pain, constipation, diarrhea and vomiting.  Musculoskeletal: Negative for arthralgias, myalgias, neck pain and neck stiffness.  Skin: Negative for rash and wound.  Allergic/Immunologic: Negative for immunocompromised state.  Neurological: Positive for headaches. Negative for dizziness, speech difficulty and weakness.  Hematological: Negative for adenopathy. Does not bruise/bleed easily.  Psychiatric/Behavioral: Negative for confusion.  All other systems reviewed and are negative.    Physical Exam Updated Vital Signs BP 120/78 (BP Location: Left Arm)   Pulse 78   Temp 98.3 F (36.8 C) (Oral)   Resp 16   Ht 5' 7.5" (1.715 m)   Wt 68.9 kg   SpO2 100%   BMI 23.46 kg/m   Physical Exam  Constitutional: She is oriented to person, place, and time. She appears well-developed and well-nourished. No distress.  HENT:  Head: Normocephalic and atraumatic.  Eyes: Pupils are equal, round, and reactive to light. EOM are normal.  Neck: Normal range of motion. Neck supple.  Cardiovascular: Normal rate, regular rhythm and normal heart sounds.  Pulmonary/Chest: Effort normal and breath sounds normal.  Neurological: She is alert and oriented to person, place, and time. She has normal strength. She is not disoriented. She displays normal reflexes. No cranial nerve deficit or sensory deficit. She displays a negative Romberg sign. Coordination and gait normal. GCS eye subscore is 4. GCS verbal subscore is 5. GCS motor  subscore is 6.  Skin: She is not diaphoretic.  Psychiatric: She has a normal mood and affect. Her behavior is normal.  Nursing note and vitals reviewed.    ED Treatments / Results  Labs (all labs ordered are listed, but only abnormal results are displayed) Labs Reviewed  PREGNANCY, URINE    EKG None  Radiology Ct Head Wo Contrast  Result Date: 04/08/2018 CLINICAL DATA:  Headache. EXAM: CT HEAD WITHOUT CONTRAST TECHNIQUE: Contiguous axial images were obtained from the base of the skull through the vertex without intravenous contrast. COMPARISON:  None. FINDINGS: Brain: No evidence of acute infarction, hemorrhage, hydrocephalus, extra-axial collection or mass lesion/mass effect. Vascular: No hyperdense vessel or unexpected calcification. Skull: Normal. Negative for fracture or focal lesion. Sinuses/Orbits: No acute finding. Other: None. IMPRESSION: Normal head CT. Electronically Signed   By: Lupita Raider, M.D.   On: 04/08/2018 17:21    Procedures Procedures (including critical care time)  Medications Ordered in ED Medications  sodium chloride 0.9 % bolus 1,000 mL (0 mLs Intravenous Stopped 04/08/18 1725)  dexamethasone (DECADRON) injection 10 mg (10 mg Intravenous Given 04/08/18 1739)  diphenhydrAMINE (BENADRYL) capsule 25 mg (25 mg Oral Given 04/08/18 1739)  ketorolac (TORADOL) 15 MG/ML injection 15 mg (15 mg Intravenous Given 04/08/18 1741)  ondansetron (ZOFRAN) injection 4 mg (4 mg Intravenous Given 04/08/18 1740)     Initial Impression / Assessment and Plan / ED Course  I have reviewed the triage vital signs and the nursing notes.  Pertinent labs & imaging results that were available during my care of the patient were reviewed by me and considered in my medical decision making (see chart for details).  Clinical Course as of Apr 08 1910  Tue Apr 08, 2018  168 22 year old female presents with complaint of right occipital headache.  Exam is unremarkable, no history of similar  headaches previously, CT head without contrast was normal.  Patient was given IV fluids as well as Toradol, Zofran, Decadron, Benadryl.  Given referral for local PCP for follow-up, return to ER for worsening or concerning symptoms.   [LM]    Clinical Course User Index [LM] Jeannie Fend, PA-C   Final Clinical Impressions(s) / ED Diagnoses   Final diagnoses:  Acute nonintractable headache, unspecified headache type    ED Discharge Orders    None       Jeannie Fend, PA-C 04/08/18 1911    Melene Plan, DO 04/08/18 2226

## 2018-04-08 NOTE — ED Triage Notes (Signed)
Headaches since Friday. Tried maloxicam, tylenol, and nothing is helping.

## 2019-01-09 ENCOUNTER — Encounter (HOSPITAL_COMMUNITY): Payer: Self-pay

## 2019-01-09 ENCOUNTER — Ambulatory Visit (HOSPITAL_COMMUNITY)
Admission: EM | Admit: 2019-01-09 | Discharge: 2019-01-09 | Disposition: A | Discharge status: Home / Self Care | Payer: BC Managed Care – PPO | Attending: Emergency Medicine | Admitting: Emergency Medicine

## 2019-01-09 DIAGNOSIS — K5900 Constipation, unspecified: Secondary | ICD-10-CM

## 2019-01-09 DIAGNOSIS — Z3202 Encounter for pregnancy test, result negative: Secondary | ICD-10-CM | POA: Diagnosis not present

## 2019-01-09 LAB — POCT URINALYSIS DIP (DEVICE)
Bilirubin Urine: NEGATIVE
Glucose, UA: NEGATIVE mg/dL
Hgb urine dipstick: NEGATIVE
Ketones, ur: NEGATIVE mg/dL
Leukocytes,Ua: NEGATIVE
Nitrite: NEGATIVE
Protein, ur: NEGATIVE mg/dL
Specific Gravity, Urine: 1.025 (ref 1.005–1.030)
Urobilinogen, UA: 1 mg/dL (ref 0.0–1.0)
pH: 7 (ref 5.0–8.0)

## 2019-01-09 LAB — POCT PREGNANCY, URINE: Preg Test, Ur: NEGATIVE

## 2019-01-09 MED ORDER — OMEPRAZOLE 20 MG PO CPDR: 20.0000 mg | DELAYED_RELEASE_CAPSULE | Freq: Every day | ORAL | 0 refills | Status: DC

## 2019-01-09 MED ORDER — POLYETHYLENE GLYCOL 3350 17 GM/SCOOP PO POWD: 17.0000 g | Freq: Every day | ORAL | 0 refills | Status: DC

## 2019-01-09 NOTE — Discharge Instructions (Addendum)
Increase your water intake.  Miralax as needed, start off with once daily, to promote regular bowel movements.  Increase fiber in your diet.  May take daily omeprazole to prevent acid reflux.  Please return for any worsening of symptoms, increased pain, fevers, vomiting or otherwise worsening.

## 2019-01-09 NOTE — ED Provider Notes (Signed)
[facility]    CSN: [id number] Arrival date & time: 01/09/19  1436     History   Chief Complaint Chief Complaint  Patient presents with  . Abdominal Pain  . Urinary Frequency    HPI Anna Dillon is a 23 y.o. female.   Anna Dillon presents with complaints of nausea and change in bowel movement habits. Feels loss of appetite and that she is unable to eat as much as typical before feeling full. No fevers. No vomiting. Constantly feels queasy. No pain necessarily. Denies any urinary symptoms. States her BM's have been green, orange/brown and at times have had white stringy mucus appearance. Had a BM today and yesterday. States there are times when she feels "burning" to her stomach as if she needs to have a BM but then can only pass gas. She feels her BM's have been decreased in frequency. When she does pass stool it is not difficult to do so, however. States in April this year she had either a fissure or hemorrhoid with some itching and blood streaking. This resolved. History of constipation, has required laxatives and treatment for this in the past. Typically has had more pain, however. No urinary symptoms. No vaginal symptoms. Still has been able to eat some but eating less.    ROS per HPI, negative if not otherwise mentioned.      Past Medical History:  Diagnosis Date  . Migraine     There are no active problems to display for this patient.   Past Surgical History:  Procedure Laterality Date  . TONSILLECTOMY  10/2015    OB History   No obstetric history on file.      Home Medications    Prior to Admission medications   Medication Sig Start Date End Date Taking? Authorizing Provider  acetaminophen (TYLENOL) 500 MG tablet Take 500 mg by mouth every 6 (six) hours as needed.    [provider]  diphenhydrAMINE (BENADRYL) 25 MG tablet Take 1 tablet (25 mg total) by mouth every 6 (six) hours. 10/26/17   Charlynne PanderYao, David Hsienta, MD  hydrocortisone 2.5 %  ointment Apply topically 2 (two) times daily. 10/26/17   Charlynne PanderYao, David Hsienta, MD  ibuprofen (ADVIL,MOTRIN) 800 MG tablet Take 1 tablet (800 mg total) by mouth 3 (three) times daily. 10/29/17   Palumbo, April, MD  omeprazole (PRILOSEC) 20 MG capsule Take 1 capsule (20 mg total) by mouth daily. 01/09/19   Linus MakoBurky, Macgregor Aeschliman B, NP  polyethylene glycol powder (GLYCOLAX/MIRALAX) 17 GM/SCOOP powder Take 17 g by mouth daily. 01/09/19   Georgetta HaberBurky, Elika Godar B, NP  famotidine (PEPCID) 20 MG tablet Take 1 tablet (20 mg total) by mouth 2 (two) times daily. 10/29/17 01/09/19  Palumbo, April, MD    Family History History reviewed. No pertinent family history.  Social History Social History   Tobacco Use  . Smoking status: Never Smoker  . Smokeless tobacco: Never Used  Substance Use Topics  . Alcohol use: Not Currently  . Drug use: No     Allergies   Augmentin [amoxicillin-pot clavulanate]   Review of Systems Review of Systems   Physical Exam Triage Vital Signs ED Triage Vitals  Enc Vitals Group     BP 01/09/19 1528 (!) 129/94     Pulse Rate 01/09/19 1528 90     Resp 01/09/19 1528 18     Temp 01/09/19 1528 98.4 F (36.9 C)     Temp Source 01/09/19 1528 Oral     SpO2 01/09/19 1528  98 %     Weight --      Height --      Head Circumference --      Peak Flow --      Pain Score 01/09/19 1529 8     Pain Loc --      Pain Edu? --      Excl. in [place]? --    No data found.  Updated Vital Signs BP (!) 129/94 (BP Location: Left Arm)   Pulse 90   Temp 98.4 F (36.9 C) (Oral)   Resp 18   SpO2 98%    Physical Exam Constitutional:      General: She is not in acute distress.    Appearance: She is well-developed.  Cardiovascular:     Rate and Rhythm: Normal rate.     Heart sounds: Normal heart sounds.  Pulmonary:     Effort: Pulmonary effort is normal.  Abdominal:     General: Abdomen is flat.     Palpations: Abdomen is soft.     Tenderness: There is no abdominal tenderness. There is no right  CVA tenderness, left CVA tenderness, guarding or rebound.  Skin:    General: Skin is warm and dry.  Neurological:     Mental Status: She is alert and oriented to person, place, and time.      UC Treatments / Results  Labs (all labs ordered are listed, but only abnormal results are displayed) Labs Reviewed  POC URINE PREG, ED  POCT URINALYSIS DIP (DEVICE)  POCT PREGNANCY, URINE    EKG   Radiology No results found.  Procedures Procedures (including critical care time)  Medications Ordered in UC Medications - No data to display  Initial Impression / Assessment and Plan / UC Course  I have reviewed the triage vital signs and the nursing notes.  Pertinent labs & imaging results that were available during my care of the patient were reviewed by me and considered in my medical decision making (see chart for details).     Non toxic in appearance. Afebrile. No vomiting. No diarrhea. Change in bowel movements, early satiety and decreased appetite. Recent hemorrhoids vs fissures. History of constipation. This still sounds likely to be constipation. Treatment discussed. Return precautions provided. Patient verbalized understanding and agreeable to plan.   Final Clinical Impressions(s) / UC Diagnoses   Final diagnoses:  Constipation, unspecified constipation type     Discharge Instructions     Increase your water intake.  Miralax as needed, start off with once daily, to promote regular bowel movements.  Increase fiber in your diet.  May take daily omeprazole to prevent acid reflux.  Please return for any worsening of symptoms, increased pain, fevers, vomiting or otherwise worsening.     ED Prescriptions    Medication Sig Dispense Auth. Provider   omeprazole (PRILOSEC) 20 MG capsule Take 1 capsule (20 mg total) by mouth daily. 30 capsule Augusto Gamble B, NP   polyethylene glycol powder (GLYCOLAX/MIRALAX) 17 GM/SCOOP powder Take 17 g by mouth daily. 255 g Zigmund Gottron, NP     Controlled Substance Prescriptions [place] Controlled Substance Registry consulted? Not Applicable   Zigmund Gottron, NP 01/09/19 [phone]

## 2019-01-09 NOTE — ED Triage Notes (Signed)
Pt C/o abdominal pain and urinary frequency. Symptoms started over a 1 week ago. Pt states that when she had a bowel movement she notice white parasite in her stool, she not sure it was warms and they was not moving.

## 2019-04-23 ENCOUNTER — Other Ambulatory Visit: Payer: Self-pay | Admitting: Family Medicine

## 2019-04-23 ENCOUNTER — Ambulatory Visit
Admission: RE | Admit: 2019-04-23 | Discharge: 2019-04-23 | Disposition: A | Discharge status: Home / Self Care | Payer: BC Managed Care – PPO | Source: Ambulatory Visit | Attending: Family Medicine | Admitting: Family Medicine

## 2019-04-23 ENCOUNTER — Other Ambulatory Visit: Payer: Self-pay

## 2019-04-23 ENCOUNTER — Other Ambulatory Visit (HOSPITAL_COMMUNITY): Payer: Self-pay | Admitting: Family Medicine

## 2019-04-23 DIAGNOSIS — M79662 Pain in left lower leg: Secondary | ICD-10-CM | POA: Diagnosis present

## 2019-04-23 DIAGNOSIS — M7662 Achilles tendinitis, left leg: Secondary | ICD-10-CM | POA: Insufficient documentation

## 2019-04-28 ENCOUNTER — Ambulatory Visit: Payer: BC Managed Care – PPO

## 2019-07-13 LAB — HM PAP SMEAR: HM Pap smear: NEGATIVE

## 2019-11-10 ENCOUNTER — Encounter: Payer: Self-pay | Admitting: Emergency Medicine

## 2019-11-10 ENCOUNTER — Other Ambulatory Visit: Payer: Self-pay

## 2019-11-10 ENCOUNTER — Emergency Department
Admission: EM | Admit: 2019-11-10 | Discharge: 2019-11-10 | Disposition: A | Discharge status: Home / Self Care | Payer: BC Managed Care – PPO | Attending: Emergency Medicine | Admitting: Emergency Medicine

## 2019-11-10 DIAGNOSIS — R112 Nausea with vomiting, unspecified: Secondary | ICD-10-CM

## 2019-11-10 DIAGNOSIS — Z79899 Other long term (current) drug therapy: Secondary | ICD-10-CM | POA: Diagnosis not present

## 2019-11-10 DIAGNOSIS — G43909 Migraine, unspecified, not intractable, without status migrainosus: Secondary | ICD-10-CM | POA: Insufficient documentation

## 2019-11-10 DIAGNOSIS — R110 Nausea: Secondary | ICD-10-CM | POA: Diagnosis present

## 2019-11-10 LAB — CBC
HCT: 42.9 % (ref 36.0–46.0)
Hemoglobin: 14.7 g/dL (ref 12.0–15.0)
MCH: 29.9 pg (ref 26.0–34.0)
MCHC: 34.3 g/dL (ref 30.0–36.0)
MCV: 87.4 fL (ref 80.0–100.0)
Platelets: 242 10*3/uL (ref 150–400)
RBC: 4.91 MIL/uL (ref 3.87–5.11)
RDW: 12.6 % (ref 11.5–15.5)
WBC: 6 10*3/uL (ref 4.0–10.5)
nRBC: 0 % (ref 0.0–0.2)

## 2019-11-10 LAB — COMPREHENSIVE METABOLIC PANEL
ALT: 13 U/L (ref 0–44)
AST: 21 U/L (ref 15–41)
Albumin: 4.8 g/dL (ref 3.5–5.0)
Alkaline Phosphatase: 40 U/L (ref 38–126)
Anion gap: 9 (ref 5–15)
BUN: 9 mg/dL (ref 6–20)
CO2: 26 mmol/L (ref 22–32)
Calcium: 9.7 mg/dL (ref 8.9–10.3)
Chloride: 108 mmol/L (ref 98–111)
Creatinine, Ser: 0.86 mg/dL (ref 0.44–1.00)
GFR calc Af Amer: >60 mL/min (ref >60)
GFR calc non Af Amer: >60 mL/min (ref >60)
Glucose, Bld: 101 mg/dL — ABNORMAL HIGH (ref 70–99)
Potassium: 4.4 mmol/L (ref 3.5–5.1)
Sodium: 143 mmol/L (ref 135–145)
Total Bilirubin: 1.7 mg/dL — ABNORMAL HIGH (ref 0.3–1.2)
Total Protein: 7.9 g/dL (ref 6.5–8.1)

## 2019-11-10 LAB — LIPASE, BLOOD: Lipase: 21 U/L (ref 11–51)

## 2019-11-10 MED ORDER — KETOROLAC TROMETHAMINE 30 MG/ML IJ SOLN
15.0000 mg | Freq: Once | INTRAMUSCULAR | Status: AC
  Administered 2019-11-10: 15 mg via INTRAVENOUS
  Filled 2019-11-10: qty 1

## 2019-11-10 MED ORDER — ONDANSETRON 4 MG PO TBDP: 4.0000 mg | ORAL_TABLET | Freq: Once | ORAL | Status: DC | PRN

## 2019-11-10 MED ORDER — BUTALBITAL-APAP-CAFFEINE 50-325-40 MG PO TABS: 1.0000 | ORAL_TABLET | Freq: Four times a day (QID) | ORAL | 0 refills | Status: DC | PRN

## 2019-11-10 MED ORDER — METOCLOPRAMIDE HCL 5 MG PO TABS: 5.0000 mg | ORAL_TABLET | Freq: Three times a day (TID) | ORAL | 1 refills | Status: DC

## 2019-11-10 MED ORDER — SODIUM CHLORIDE 0.9 % IV SOLN
1000.0000 mL | Freq: Once | INTRAVENOUS | Status: AC
  Administered 2019-11-10: 1000 mL via INTRAVENOUS

## 2019-11-10 MED ORDER — METOCLOPRAMIDE HCL 5 MG/ML IJ SOLN
20.0000 mg | Freq: Once | INTRAVENOUS | Status: AC
  Administered 2019-11-10: 20 mg via INTRAVENOUS
  Filled 2019-11-10: qty 4

## 2019-11-10 MED ORDER — DIPHENHYDRAMINE HCL 50 MG/ML IJ SOLN
25.0000 mg | Freq: Once | INTRAMUSCULAR | Status: AC
  Administered 2019-11-10: 25 mg via INTRAVENOUS
  Filled 2019-11-10: qty 1

## 2019-11-10 MED ORDER — ONDANSETRON HCL 4 MG/2ML IJ SOLN
4.0000 mg | Freq: Once | INTRAMUSCULAR | Status: AC
  Administered 2019-11-10: 4 mg via INTRAVENOUS
  Filled 2019-11-10: qty 2

## 2019-11-10 NOTE — ED Notes (Signed)
Pt reports no improvement in headache or neck pain, pt reports she was dx with migraines in Dec and feels similar.

## 2019-11-10 NOTE — ED Provider Notes (Signed)
[facility] Emergency Department Provider Note   ____________________________________________    I have reviewed the triage vital signs and the nursing notes.   HISTORY  Chief Complaint Emesis and Headache     HPI Anna Dillon is a 24 y.o. female with a history of migraines who presents with complaints of nausea and headache.  Patient reports she has had nausea intermittently over the last 3 weeks, and was initially told she may have a viral illness.  Did go see PCP last week she noticed a small mount of blood in her vomitus.  Was doing better except recently developed global throbbing headache with nausea consistent with prior migraines.  No neuro deficits.  No fevers or chills.  Reports a similar episode of intermittent nausea 1 year ago  Past Medical History:  Diagnosis Date  . Migraine     There are no problems to display for this patient.   Past Surgical History:  Procedure Laterality Date  . TONSILLECTOMY  10/2015    Prior to Admission medications   Medication Sig Start Date End Date Taking? Authorizing Provider  acetaminophen (TYLENOL) 500 MG tablet Take 500 mg by mouth every 6 (six) hours as needed.    [provider]  butalbital-acetaminophen-caffeine (FIORICET) [phone] MG tablet Take 1-2 tablets by mouth every 6 (six) hours as needed for headache. 11/10/19 11/09/20  Jene Every, MD  diphenhydrAMINE (BENADRYL) 25 MG tablet Take 1 tablet (25 mg total) by mouth every 6 (six) hours. 10/26/17   Charlynne Pander, MD  hydrocortisone 2.5 % ointment Apply topically 2 (two) times daily. 10/26/17   Charlynne Pander, MD  ibuprofen (ADVIL,MOTRIN) 800 MG tablet Take 1 tablet (800 mg total) by mouth 3 (three) times daily. 10/29/17   Palumbo, April, MD  metoCLOPramide (REGLAN) 5 MG tablet Take 1 tablet (5 mg total) by mouth 3 (three) times daily. 11/10/19 11/09/20  Jene Every, MD  omeprazole (PRILOSEC) 20 MG capsule Take 1 capsule (20 mg  total) by mouth daily. 01/09/19   Linus Mako B, NP  polyethylene glycol powder (GLYCOLAX/MIRALAX) 17 GM/SCOOP powder Take 17 g by mouth daily. 01/09/19   Georgetta Haber, NP  famotidine (PEPCID) 20 MG tablet Take 1 tablet (20 mg total) by mouth 2 (two) times daily. 10/29/17 01/09/19  Palumbo, April, MD     Allergies Augmentin [amoxicillin-pot clavulanate]  No family history on file.  Social History Social History   Tobacco Use  . Smoking status: Never Smoker  . Smokeless tobacco: Never Used  Substance Use Topics  . Alcohol use: Not Currently  . Drug use: No    Review of Systems  Constitutional: No fever/chills Eyes: No visual changes.  ENT: No sore throat. Cardiovascular: Denies chest pain. Respiratory: Denies shortness of breath. Gastrointestinal: As above Genitourinary: Negative for dysuria. Musculoskeletal: Negative for back pain. Skin: Negative for rash. Neurological: As above   ____________________________________________   PHYSICAL EXAM:  VITAL SIGNS: ED Triage Vitals  Enc Vitals Group     BP 11/10/19 0921 125/79     Pulse Rate 11/10/19 0921 80     Resp 11/10/19 0921 16     Temp 11/10/19 0921 98.3 F (36.8 C)     Temp Source 11/10/19 0921 Oral     SpO2 11/10/19 0921 99 %     Weight 11/10/19 0921 69.9 kg (154 lb)     Height 11/10/19 0921 1.702 m (5\' 7" )     Head Circumference --  Peak Flow --      Pain Score 11/10/19 0925 9     Pain Loc --      Pain Edu? --      Excl. in [place]? --     Constitutional: Alert and oriented Eyes: Conjunctivae are normal.    Mouth/Throat: Mucous membranes are moist.   Neck:  Painless ROM Cardiovascular: Normal rate, regular rhythm. Good peripheral circulation. Respiratory: Normal respiratory effort.  No retractions.  Gastrointestinal: Soft and nontender. No distention.   Musculoskeletal: No lower extremity tenderness nor edema.  Warm and well perfused Neurologic:  Normal speech and language. No gross focal  neurologic deficits are appreciated.  Skin:  Skin is warm, dry and intact. No rash noted. Psychiatric: Mood and affect are normal. Speech and behavior are normal.  ____________________________________________   LABS (all labs ordered are listed, but only abnormal results are displayed)  Labs Reviewed  COMPREHENSIVE METABOLIC PANEL - Abnormal; Notable for the following components:      Result Value   Glucose, Bld 101 (*)    Total Bilirubin 1.7 (*)    All other components within normal limits  LIPASE, BLOOD  CBC   ____________________________________________  EKG  None ____________________________________________  RADIOLOGY  None ____________________________________________   PROCEDURES  Procedure(s) performed: No  Procedures   Critical Care performed: No ____________________________________________   INITIAL IMPRESSION / ASSESSMENT AND PLAN / ED COURSE  Pertinent labs & imaging results that were available during my care of the patient were reviewed by me and considered in my medical decision making (see chart for details).  Patient well-appearing and in no acute distress, lab work is reassuring, electrolytes/BUN/creatinine are normal.  White blood cell count is normal.  LFTs are normal.  Lipase is normal.  Abdominal exam is quite reassuring.  We will treat with IV fluids and IV Zofran.  Patient had improvement in nausea but still complains of headache.  Will give Benadryl and Reglan and Toradol as part of headache cocktail.  Patient had negative pregnancy test during clinic visit in the last several days. Patient feeling better after migraine cocktail but still with mild headache, she would like to go home to rest I think that is reasonable, will Rx Fioricet, Reglan as needed.  Return precautions discussed    ____________________________________________   FINAL CLINICAL IMPRESSION(S) / ED DIAGNOSES  Final diagnoses:  Non-intractable vomiting with nausea,  unspecified vomiting type  Migraine without status migrainosus, not intractable, unspecified migraine type        Note:  This document was prepared using Dragon voice recognition software and may include unintentional dictation errors.   Lavonia Drafts, MD 11/10/19 1521

## 2019-11-10 NOTE — ED Triage Notes (Signed)
Pt reports has been vomiting for the past 3 weeks and has had a HA since yesterday. Denies all other sx's.

## 2019-11-10 NOTE — ED Notes (Signed)
Pt updated on plan of care at this time.  

## 2019-11-10 NOTE — ED Triage Notes (Signed)
Pt states, "it feels like my body is rotting from the inside out". Pt reports her breath smells like the discharge from her vagina. Pt concerned about an infection inside of her body.  Pt requested medication for nausea and HA. Pt offered tylenol or ibuprofen and zofran. Pt reports she will just wait

## 2019-11-30 ENCOUNTER — Other Ambulatory Visit: Payer: Self-pay

## 2019-11-30 ENCOUNTER — Emergency Department
Admission: EM | Admit: 2019-11-30 | Discharge: 2019-11-30 | Disposition: A | Discharge status: Home / Self Care | Payer: BC Managed Care – PPO | Attending: Emergency Medicine | Admitting: Emergency Medicine

## 2019-11-30 ENCOUNTER — Emergency Department: Payer: BC Managed Care – PPO

## 2019-11-30 DIAGNOSIS — R519 Headache, unspecified: Secondary | ICD-10-CM

## 2019-11-30 DIAGNOSIS — Z79899 Other long term (current) drug therapy: Secondary | ICD-10-CM | POA: Diagnosis not present

## 2019-11-30 MED ORDER — KETOROLAC TROMETHAMINE 30 MG/ML IJ SOLN
15.0000 mg | Freq: Once | INTRAMUSCULAR | Status: AC
  Administered 2019-11-30: 15 mg via INTRAVENOUS
  Filled 2019-11-30: qty 1

## 2019-11-30 MED ORDER — BUTALBITAL-APAP-CAFFEINE 50-325-40 MG PO TABS: 1.0000 | ORAL_TABLET | Freq: Four times a day (QID) | ORAL | 0 refills | Status: DC | PRN

## 2019-11-30 MED ORDER — SODIUM CHLORIDE 0.9 % IV BOLUS
1000.0000 mL | Freq: Once | INTRAVENOUS | Status: AC
  Administered 2019-11-30: 1000 mL via INTRAVENOUS

## 2019-11-30 MED ORDER — METOCLOPRAMIDE HCL 5 MG/ML IJ SOLN
10.0000 mg | Freq: Once | INTRAMUSCULAR | Status: AC
  Administered 2019-11-30: 10 mg via INTRAVENOUS
  Filled 2019-11-30: qty 2

## 2019-11-30 MED ORDER — METOCLOPRAMIDE HCL 10 MG PO TABS: 10.0000 mg | ORAL_TABLET | Freq: Four times a day (QID) | ORAL | 0 refills | Status: DC | PRN

## 2019-11-30 MED ORDER — DIPHENHYDRAMINE HCL 50 MG/ML IJ SOLN
50.0000 mg | Freq: Once | INTRAMUSCULAR | Status: AC
  Administered 2019-11-30: 50 mg via INTRAVENOUS
  Filled 2019-11-30: qty 1

## 2019-11-30 NOTE — ED Notes (Signed)
Pt states takes fioricet at home already without relief. EDP Paduchowski notified.

## 2019-11-30 NOTE — ED Provider Notes (Signed)
[facility] Emergency Department Provider Note  Time seen: 8:59 AM  I have reviewed the triage vital signs and the nursing notes.   HISTORY  Chief Complaint Headache   HPI Anna Dillon is a 24 y.o. female with a past medical history of migraines presents to the emergency department for a headache.  According to the patient for the past 3 days she has been experiencing a moderate to severe headache which seems typical of her migraines but states they have been worsening in severity to the point where she now gets nauseated with vomiting from her migraines including over the last several days.  Also states photo and phonophobia which are also typical of her migraines.  Patient states the migraines have been occurring more frequently and have been more severe.  Denies any recent brain imaging.  Denies any fever.  No cough or shortness of breath.  Largely negative review of systems otherwise.   Past Medical History:  Diagnosis Date  . Migraine     There are no problems to display for this patient.   Past Surgical History:  Procedure Laterality Date  . TONSILLECTOMY  10/2015    Prior to Admission medications   Medication Sig Start Date End Date Taking? Authorizing Provider  acetaminophen (TYLENOL) 500 MG tablet Take 500 mg by mouth every 6 (six) hours as needed.    [provider]  butalbital-acetaminophen-caffeine (FIORICET) [phone] MG tablet Take 1-2 tablets by mouth every 6 (six) hours as needed for headache. 11/10/19 11/09/20  Lavonia Drafts, MD  diphenhydrAMINE (BENADRYL) 25 MG tablet Take 1 tablet (25 mg total) by mouth every 6 (six) hours. 10/26/17   Drenda Freeze, MD  hydrocortisone 2.5 % ointment Apply topically 2 (two) times daily. 10/26/17   Drenda Freeze, MD  ibuprofen (ADVIL,MOTRIN) 800 MG tablet Take 1 tablet (800 mg total) by mouth 3 (three) times daily. 10/29/17   Palumbo, April, MD  metoCLOPramide (REGLAN) 5 MG tablet Take 1 tablet  (5 mg total) by mouth 3 (three) times daily. 11/10/19 11/09/20  Lavonia Drafts, MD  omeprazole (PRILOSEC) 20 MG capsule Take 1 capsule (20 mg total) by mouth daily. 01/09/19   Augusto Gamble B, NP  polyethylene glycol powder (GLYCOLAX/MIRALAX) 17 GM/SCOOP powder Take 17 g by mouth daily. 01/09/19   Zigmund Gottron, NP  famotidine (PEPCID) 20 MG tablet Take 1 tablet (20 mg total) by mouth 2 (two) times daily. 10/29/17 01/09/19  Palumbo, April, MD    Allergies  Allergen Reactions  . Augmentin [Amoxicillin-Pot Clavulanate]     Excessive vomiting    No family history on file.  Social History Social History   Tobacco Use  . Smoking status: Never Smoker  . Smokeless tobacco: Never Used  Substance Use Topics  . Alcohol use: Not Currently  . Drug use: No    Review of Systems Constitutional: Negative for fever. Cardiovascular: Negative for chest pain. Respiratory: Negative for shortness of breath. Gastrointestinal: Negative for abdominal pain Musculoskeletal: Negative for musculoskeletal complaints Neurological: Positive for headache.  Denies weakness or numbness.  No confusion. All other ROS negative  ____________________________________________   PHYSICAL EXAM:  VITAL SIGNS: ED Triage Vitals  Enc Vitals Group     BP 11/30/19 0718 127/76     Pulse Rate 11/30/19 0718 78     Resp 11/30/19 0718 18     Temp 11/30/19 0718 99 F (37.2 C)     Temp Source 11/30/19 0718 Oral     SpO2 11/30/19  0718 99 %     Weight 11/30/19 0713 154 lb (69.9 kg)     Height 11/30/19 0713 5\' 7"  (1.702 m)     Head Circumference --      Peak Flow --      Pain Score 11/30/19 0718 10     Pain Loc --      Pain Edu? --      Excl. in GC? --    Constitutional: Alert and oriented. Well appearing and in no distress. Eyes: Normal exam ENT      Head: Normocephalic and atraumatic.      Mouth/Throat: Mucous membranes are moist. Cardiovascular: Normal rate, regular rhythm.  Respiratory: Normal respiratory  effort without tachypnea nor retractions. Breath sounds are clear  Gastrointestinal: Soft and nontender. No distention.   Musculoskeletal: Nontender with normal range of motion in all extremities.  Neurologic:  Normal speech and language. No gross focal neurologic deficits.  Equal grip strengths.  No pronator drift. Skin:  Skin is warm, dry and intact.  Psychiatric: Mood and affect are normal.   ____________________________________________   RADIOLOGY  CT head shows a stable arachnoid cyst otherwise negative  ____________________________________________   INITIAL IMPRESSION / ASSESSMENT AND PLAN / ED COURSE  Pertinent labs & imaging results that were available during my care of the patient were reviewed by me and considered in my medical decision making (see chart for details).   Patient presents emergency department for moderate headache ongoing for the past 3 days along with intermittent nausea and vomiting.  Patient has a history of migraines however states this 1 seems more severe.  Denies any recent brain imaging.  We will obtain a CT scan of the head as a precaution.  We will treat with Toradol Reglan and Benadryl.  Reassuringly overall the patient appears quite well with a normal neurological exam.  CT scan of the head shows a stable arachnoid cyst otherwise negative CT imaging.  Patient feeling better after medications.  We will discharge with Fioricet and neurology follow-up.  Anna Dillon was evaluated in Emergency Department on 11/30/2019 for the symptoms described in the history of present illness. She was evaluated in the context of the global COVID-19 pandemic, which necessitated consideration that the patient might be at risk for infection with the SARS-CoV-2 virus that causes COVID-19. Institutional protocols and algorithms that pertain to the evaluation of patients at risk for COVID-19 are in a state of rapid change based on information released by regulatory bodies including  the CDC and federal and state organizations. These policies and algorithms were followed during the patient's care in the ED.  ____________________________________________   FINAL CLINICAL IMPRESSION(S) / ED DIAGNOSES  Headache   12/02/2019, MD 11/30/19 1009

## 2019-11-30 NOTE — ED Triage Notes (Signed)
Pt c/o migraine for the past 3 days with feeling lightheaded, N/V.Marland Kitchenwas just here last week for the same thing.

## 2019-11-30 NOTE — ED Notes (Signed)
Patient given blanket upon request

## 2020-06-26 IMAGING — CT CT HEAD W/O CM
2 series · 15 of 38 positions shown, 18 images · non-contrast
Comparison: April 08, 2018

CLINICAL DATA: Right occipital headache and intermittent dizziness

EXAM:
CT HEAD WITHOUT CONTRAST
TECHNIQUE: Contiguous axial images were obtained from the base of the skull
through the vertex without intravenous contrast.

[Series 3: head bone · axial · 0.41mm/px · z∈[-132,-17]mm · 12 of 28 slices shown, 15 images]
[im 3/28  brain]
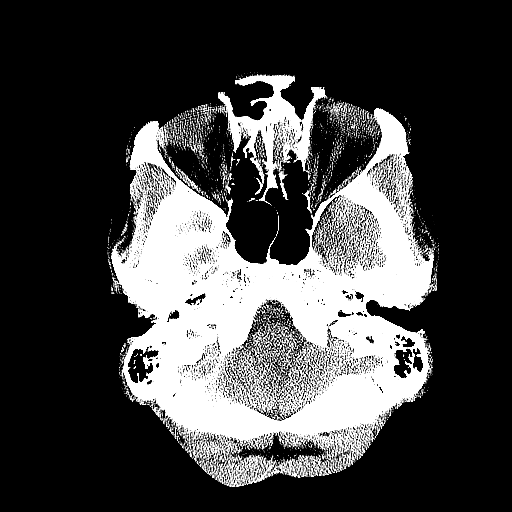
[im 3/28  bone]
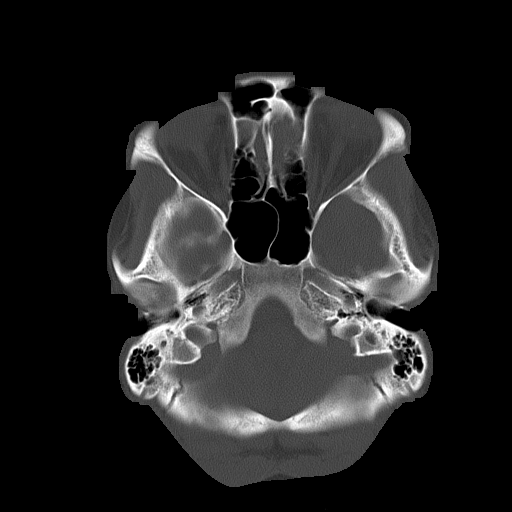
[im 5/28  brain]
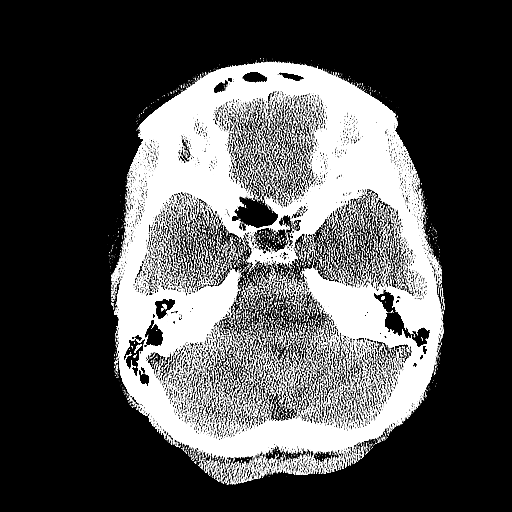
[im 7/28  brain]
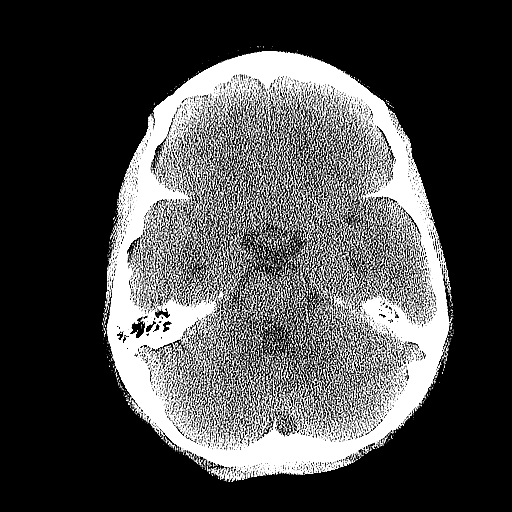
[im 9/28  brain]
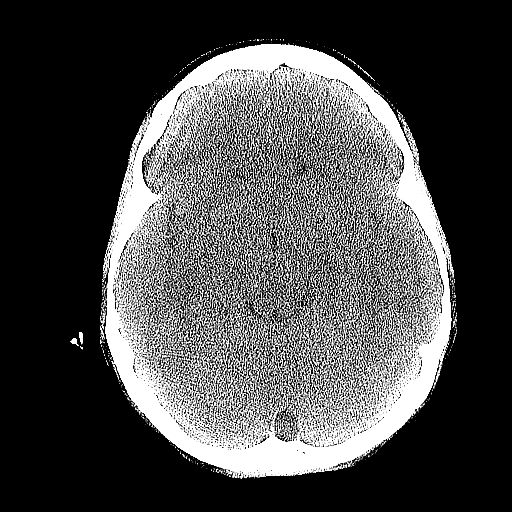
[im 11/28  brain]
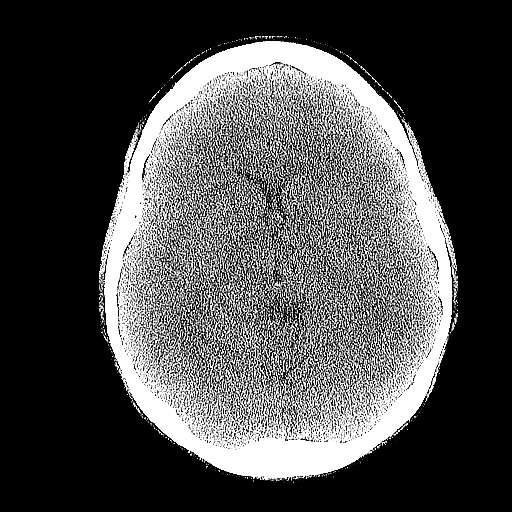
[im 11/28  bone]
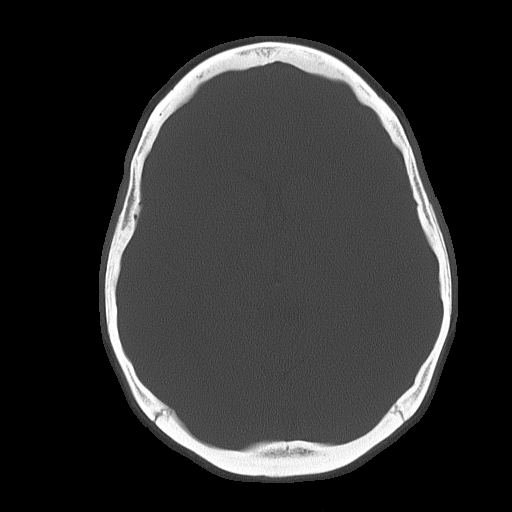
[im 13/28  brain]
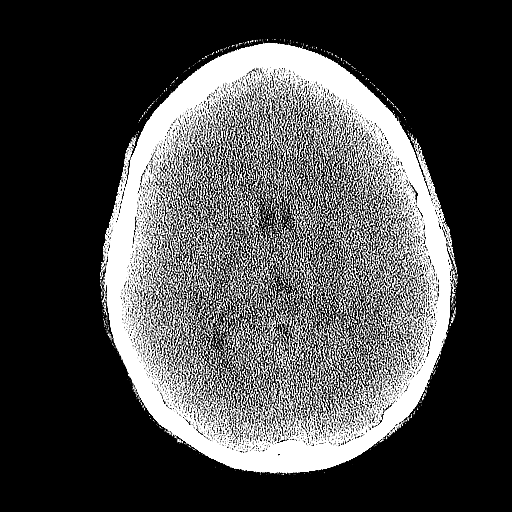
[im 16/28  brain]
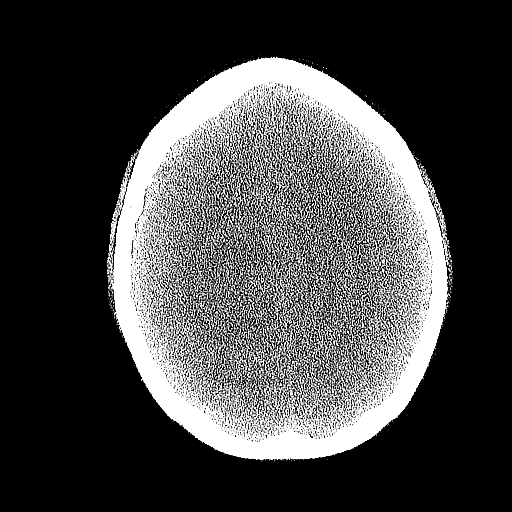
[im 18/28  brain]
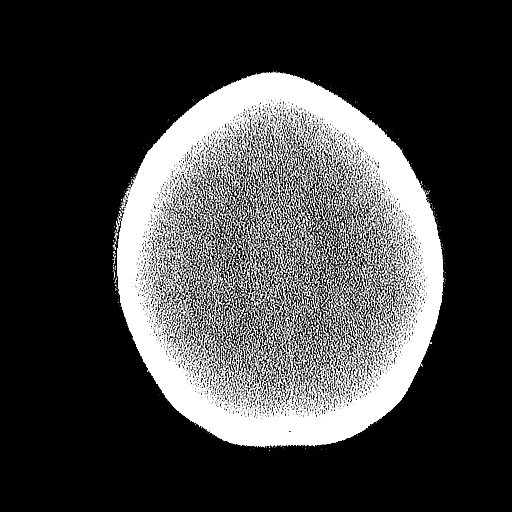
[im 20/28  brain]
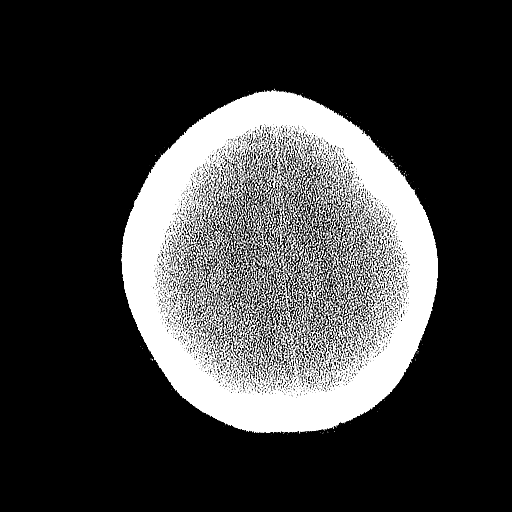
[im 20/28  bone]
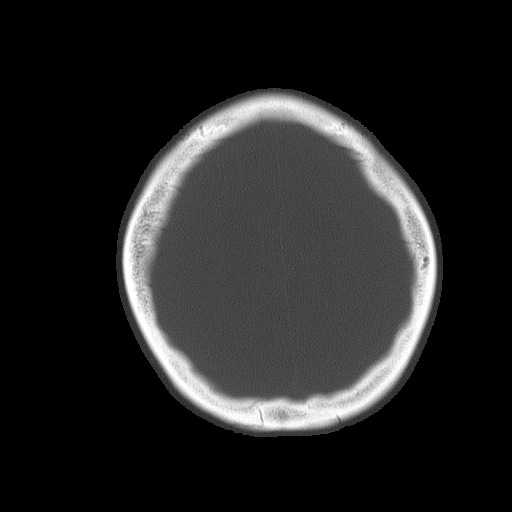
[im 22/28  brain]
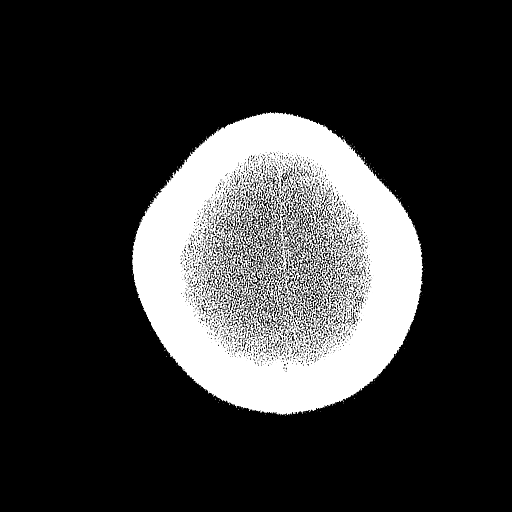
[im 24/28  brain]
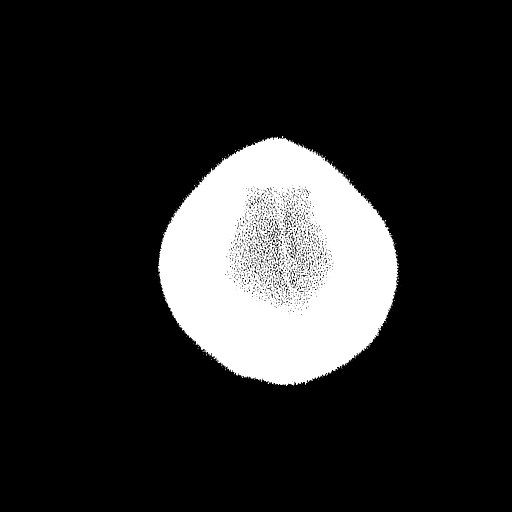
[im 26/28  brain]
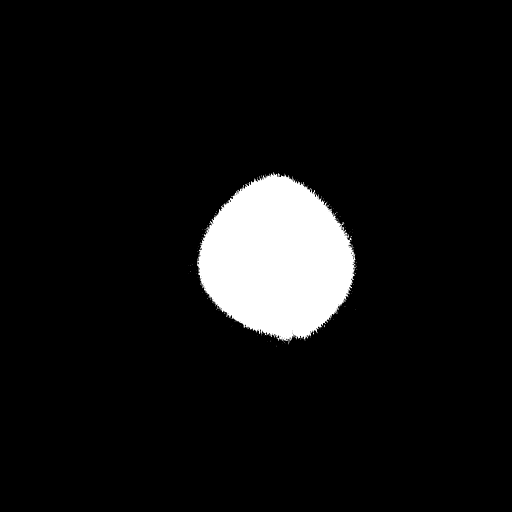

[Series 4: coronal soft tissue · coronal · 0.29mm/px · 3 of 63 slices shown]
[im 21/63  brain]
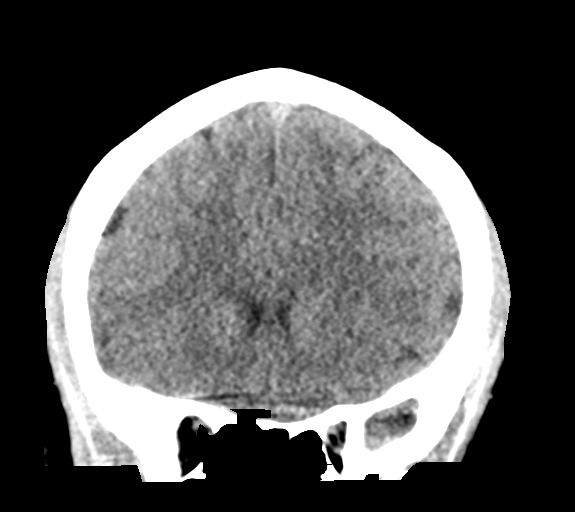
[im 28/63  brain]
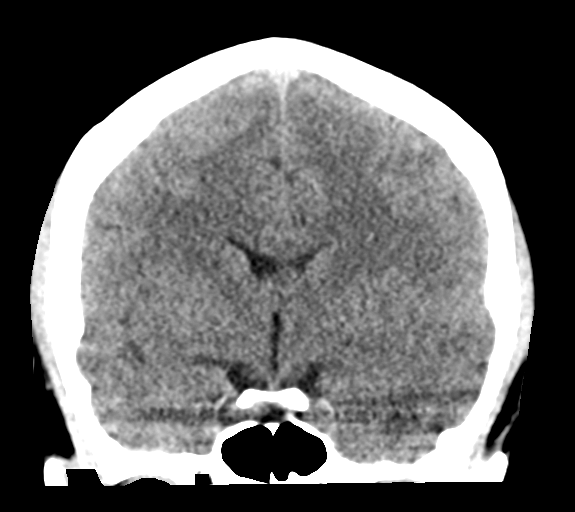
[im 35/63  brain]
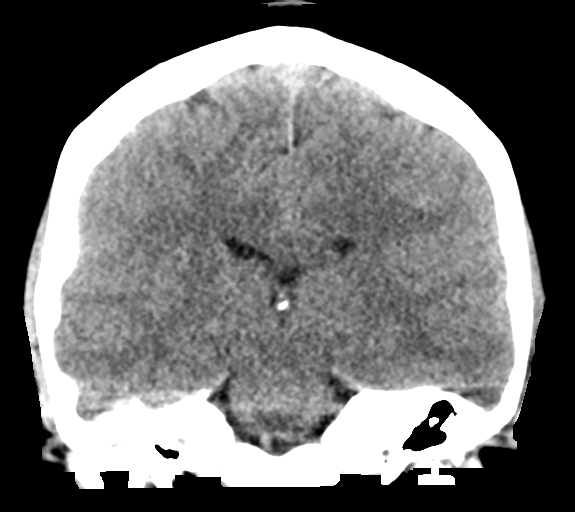

[15 of 38 positions shown; findings below may reference images not displayed]

FINDINGS: Brain: Ventricles and sulci are normal in size and configuration.
There is a left posterior fossa arachnoid cyst, a benign finding,
measuring 2.5 x 1.0 cm. No other evident mass. There is no
hemorrhage. There is no subdural or epidural fluid collection. No
midline shift.

Vascular: No hyperdense vessel.  No vascular calcifications evident.

Skull: Bony calvarium appears intact.

Sinuses/Orbits: Visualized paranasal sinuses are clear. Visualized
orbits appear symmetric bilaterally.

Other: Mastoid air cells are clear.
IMPRESSION: Benign posterior fossa arachnoid cyst just to the left of midline,
stable. Study otherwise unremarkable. Note in particular that the
brain parenchyma appears unremarkable. No hemorrhage.

## 2020-08-02 LAB — HM PAP SMEAR: HPV, high-risk: NEGATIVE

## 2021-02-21 ENCOUNTER — Other Ambulatory Visit (HOSPITAL_BASED_OUTPATIENT_CLINIC_OR_DEPARTMENT_OTHER): Payer: Self-pay

## 2021-02-21 ENCOUNTER — Emergency Department (HOSPITAL_BASED_OUTPATIENT_CLINIC_OR_DEPARTMENT_OTHER)
Admission: EM | Admit: 2021-02-21 | Discharge: 2021-02-21 | Disposition: A | Discharge status: Home / Self Care | Payer: 59 | Attending: Emergency Medicine | Admitting: Emergency Medicine

## 2021-02-21 ENCOUNTER — Other Ambulatory Visit: Payer: Self-pay

## 2021-02-21 ENCOUNTER — Encounter (HOSPITAL_BASED_OUTPATIENT_CLINIC_OR_DEPARTMENT_OTHER): Payer: Self-pay | Admitting: Emergency Medicine

## 2021-02-21 DIAGNOSIS — R197 Diarrhea, unspecified: Secondary | ICD-10-CM | POA: Diagnosis present

## 2021-02-21 DIAGNOSIS — R112 Nausea with vomiting, unspecified: Secondary | ICD-10-CM | POA: Diagnosis not present

## 2021-02-21 LAB — CBC WITH DIFFERENTIAL/PLATELET
Abs Immature Granulocytes: 0.01 10*3/uL (ref 0.00–0.07)
Basophils Absolute: 0.1 10*3/uL (ref 0.0–0.1)
Basophils Relative: 1 %
Eosinophils Absolute: 0.1 10*3/uL (ref 0.0–0.5)
Eosinophils Relative: 2 %
HCT: 38.5 % (ref 36.0–46.0)
Hemoglobin: 13 g/dL (ref 12.0–15.0)
Immature Granulocytes: 0 %
Lymphocytes Relative: 33 %
Lymphs Abs: 2.7 10*3/uL (ref 0.7–4.0)
MCH: 29.8 pg (ref 26.0–34.0)
MCHC: 33.8 g/dL (ref 30.0–36.0)
MCV: 88.3 fL (ref 80.0–100.0)
Monocytes Absolute: 0.3 10*3/uL (ref 0.1–1.0)
Monocytes Relative: 3 %
Neutro Abs: 5 10*3/uL (ref 1.7–7.7)
Neutrophils Relative %: 61 %
Platelets: 236 10*3/uL (ref 150–400)
RBC: 4.36 MIL/uL (ref 3.87–5.11)
RDW: 12.9 % (ref 11.5–15.5)
WBC: 8.1 10*3/uL (ref 4.0–10.5)
nRBC: 0 % (ref 0.0–0.2)

## 2021-02-21 LAB — COMPREHENSIVE METABOLIC PANEL
ALT: 16 U/L (ref 0–44)
AST: 21 U/L (ref 15–41)
Albumin: 3.8 g/dL (ref 3.5–5.0)
Alkaline Phosphatase: 50 U/L (ref 38–126)
Anion gap: 5 (ref 5–15)
BUN: 7 mg/dL (ref 6–20)
CO2: 26 mmol/L (ref 22–32)
Calcium: 8.9 mg/dL (ref 8.9–10.3)
Chloride: 109 mmol/L (ref 98–111)
Creatinine, Ser: 0.79 mg/dL (ref 0.44–1.00)
GFR, Estimated: >60 mL/min (ref >60)
Glucose, Bld: 132 mg/dL — ABNORMAL HIGH (ref 70–99)
Potassium: 4.1 mmol/L (ref 3.5–5.1)
Sodium: 140 mmol/L (ref 135–145)
Total Bilirubin: 0.5 mg/dL (ref 0.3–1.2)
Total Protein: 6.7 g/dL (ref 6.5–8.1)

## 2021-02-21 LAB — PREGNANCY, URINE: Preg Test, Ur: NEGATIVE

## 2021-02-21 LAB — LIPASE, BLOOD: Lipase: 22 U/L (ref 11–51)

## 2021-02-21 MED ORDER — PROCHLORPERAZINE EDISYLATE 10 MG/2ML IJ SOLN: 10.0000 mg | Freq: Once | INTRAMUSCULAR | Status: DC

## 2021-02-21 MED ORDER — ONDANSETRON HCL 4 MG/2ML IJ SOLN
4.0000 mg | Freq: Once | INTRAMUSCULAR | Status: AC
  Administered 2021-02-21: 4 mg via INTRAVENOUS
  Filled 2021-02-21: qty 2

## 2021-02-21 MED ORDER — ONDANSETRON HCL 4 MG PO TABS
4.0000 mg | ORAL_TABLET | Freq: Four times a day (QID) | ORAL | 0 refills | Status: DC
  Filled 2021-02-21: qty 12, 3d supply, fill #0

## 2021-02-21 MED ORDER — SODIUM CHLORIDE 0.9 % IV BOLUS
1000.0000 mL | Freq: Once | INTRAVENOUS | Status: AC
  Administered 2021-02-21: 1000 mL via INTRAVENOUS

## 2021-02-21 MED ORDER — DIPHENHYDRAMINE HCL 50 MG/ML IJ SOLN: 25.0000 mg | Freq: Once | INTRAMUSCULAR | Status: DC

## 2021-02-21 NOTE — ED Provider Notes (Signed)
[facility] EMERGENCY DEPARTMENT Provider Note   CSN: [id number] Arrival date & time: 02/21/21  3220     History Chief Complaint  Patient presents with   Diarrhea    Anna Dillon is a 25 y.o. female.  The history is provided by the patient.  Diarrhea Quality:  Watery Severity:  Moderate Timing:  Constant Progression:  Unchanged Relieved by:  Nothing Worsened by:  Nothing Associated symptoms: vomiting   Associated symptoms: no abdominal pain, no arthralgias, no chills, no recent cough, no diaphoresis and no fever   Risk factors: suspect food intake       Past Medical History:  Diagnosis Date   Migraine     There are no problems to display for this patient.   Past Surgical History:  Procedure Laterality Date   TONSILLECTOMY  10/2015     OB History   No obstetric history on file.     History reviewed. No pertinent family history.  Social History   Tobacco Use   Smoking status: Never   Smokeless tobacco: Never  Vaping Use   Vaping Use: Never used  Substance Use Topics   Alcohol use: Not Currently   Drug use: No    Home Medications Prior to Admission medications   Medication Sig Start Date End Date Taking? Authorizing Provider  ondansetron (ZOFRAN) 4 MG tablet Take 1 tablet (4 mg total) by mouth every 6 (six) hours. 02/21/21  Yes Yitzchok Carriger, DO  acetaminophen (TYLENOL) 500 MG tablet Take 500 mg by mouth every 6 (six) hours as needed.    [provider]  diphenhydrAMINE (BENADRYL) 25 MG tablet Take 1 tablet (25 mg total) by mouth every 6 (six) hours. 10/26/17   Charlynne Pander, MD  hydrocortisone 2.5 % ointment Apply topically 2 (two) times daily. 10/26/17   Charlynne Pander, MD  ibuprofen (ADVIL,MOTRIN) 800 MG tablet Take 1 tablet (800 mg total) by mouth 3 (three) times daily. 10/29/17   Palumbo, April, MD  metoCLOPramide (REGLAN) 10 MG tablet Take 1 tablet (10 mg total) by mouth every 6 (six) hours as needed for nausea. 11/30/19    Minna Antis, MD  omeprazole (PRILOSEC) 20 MG capsule Take 1 capsule (20 mg total) by mouth daily. 01/09/19   Linus Mako B, NP  polyethylene glycol powder (GLYCOLAX/MIRALAX) 17 GM/SCOOP powder Take 17 g by mouth daily. 01/09/19   Georgetta Haber, NP  famotidine (PEPCID) 20 MG tablet Take 1 tablet (20 mg total) by mouth 2 (two) times daily. 10/29/17 01/09/19  Palumbo, April, MD    Allergies    Augmentin [amoxicillin-pot clavulanate]  Review of Systems   Review of Systems  Constitutional:  Negative for chills, diaphoresis and fever.  HENT:  Negative for ear pain and sore throat.   Eyes:  Negative for pain and visual disturbance.  Respiratory:  Negative for cough and shortness of breath.   Cardiovascular:  Negative for chest pain and palpitations.  Gastrointestinal:  Positive for diarrhea, nausea and vomiting. Negative for abdominal pain.  Genitourinary:  Negative for dysuria and hematuria.  Musculoskeletal:  Negative for arthralgias and back pain.  Skin:  Negative for color change and rash.  Neurological:  Negative for seizures and syncope.  All other systems reviewed and are negative.  Physical Exam Updated Vital Signs BP 135/85   Pulse 86   Temp 98.7 F (37.1 C) (Oral)   Resp 18   Ht 5\' 7"  (1.702 m)   Wt 83.8 kg   SpO2 100%  BMI 28.93 kg/m   Physical Exam Vitals and nursing note reviewed.  Constitutional:      General: She is not in acute distress.    Appearance: She is well-developed. She is not ill-appearing.  HENT:     Head: Normocephalic and atraumatic.     Nose: Nose normal.     Mouth/Throat:     Mouth: Mucous membranes are moist.  Eyes:     Extraocular Movements: Extraocular movements intact.     Conjunctiva/sclera: Conjunctivae normal.     Pupils: Pupils are equal, round, and reactive to light.  Cardiovascular:     Rate and Rhythm: Normal rate and regular rhythm.     Pulses: Normal pulses.     Heart sounds: Normal heart sounds. No murmur  heard. Pulmonary:     Effort: Pulmonary effort is normal. No respiratory distress.     Breath sounds: Normal breath sounds.  Abdominal:     Palpations: Abdomen is soft.     Tenderness: There is no abdominal tenderness.  Musculoskeletal:     Cervical back: Neck supple.  Skin:    General: Skin is warm and dry.     Capillary Refill: Capillary refill takes less than 2 seconds.  Neurological:     Mental Status: She is alert.    ED Results / Procedures / Treatments   Labs (all labs ordered are listed, but only abnormal results are displayed) Labs Reviewed  COMPREHENSIVE METABOLIC PANEL - Abnormal; Notable for the following components:      Result Value   Glucose, Bld 132 (*)    All other components within normal limits  CBC WITH DIFFERENTIAL/PLATELET  LIPASE, BLOOD  PREGNANCY, URINE    EKG None  Radiology No results found.  Procedures Procedures   Medications Ordered in ED Medications  sodium chloride 0.9 % bolus 1,000 mL (1,000 mLs Intravenous New Bag/Given 02/21/21 0915)  ondansetron (ZOFRAN) injection 4 mg (4 mg Intravenous Given 02/21/21 3149)    ED Course  I have reviewed the triage vital signs and the nursing notes.  Pertinent labs & imaging results that were available during my care of the patient were reviewed by me and considered in my medical decision making (see chart for details).    MDM Rules/Calculators/A&P                           Anna Dillon is here with nausea, vomiting, diarrhea.  Suspected food poisoning.  Normal vitals.  No fever.  Well-appearing.  No abdominal tenderness.  Lab work with no evidence of dehydration or infectious process.  Felt better after IV fluids and IV Zofran.  Pregnancy test negative.  Suspect foodborne illness.  No concern for appendicitis or colitis or ectopic pregnancy.  Discharged in good condition.  Understands return precautions.  This chart was dictated using voice recognition software.  Despite best efforts to  proofread,  errors can occur which can change the documentation meaning.   Final Clinical Impression(s) / ED Diagnoses Final diagnoses:  Diarrhea, unspecified type    Rx / DC Orders ED Discharge Orders          Ordered    ondansetron (ZOFRAN) 4 MG tablet  Every 6 hours        02/21/21 0947             Virgina Norfolk, DO 02/21/21 [phone]

## 2021-02-21 NOTE — ED Triage Notes (Signed)
Patient started with diarrhea at 0300. Believes may be related to food eaten for dinner at 1900. Also with abdominal pain and lightheadedness.

## 2021-02-21 NOTE — Discharge Instructions (Addendum)
Overall suspect food poisoning.  This will resolve on its own.  I have sent some nausea medicine to pharmacy.  Lab work today was normal.  No signs of dehydration.

## 2021-05-04 ENCOUNTER — Ambulatory Visit (HOSPITAL_COMMUNITY)
Admission: EM | Admit: 2021-05-04 | Discharge: 2021-05-04 | Disposition: A | Discharge status: Home / Self Care | Payer: BC Managed Care – PPO

## 2021-05-04 ENCOUNTER — Other Ambulatory Visit: Payer: Self-pay

## 2021-05-04 NOTE — ED Notes (Signed)
Tid 8 minutes .  Per rebecca, patient access.  Patient is not going to wait

## 2021-05-15 ENCOUNTER — Emergency Department (HOSPITAL_COMMUNITY)
Admission: EM | Admit: 2021-05-15 | Discharge: 2021-05-15 | Disposition: A | Discharge status: Home / Self Care | Payer: BC Managed Care – PPO | Attending: Emergency Medicine | Admitting: Emergency Medicine

## 2021-05-15 ENCOUNTER — Emergency Department (HOSPITAL_COMMUNITY): Payer: BC Managed Care – PPO

## 2021-05-15 ENCOUNTER — Encounter (HOSPITAL_COMMUNITY): Payer: Self-pay

## 2021-05-15 ENCOUNTER — Other Ambulatory Visit: Payer: Self-pay

## 2021-05-15 DIAGNOSIS — S93401A Sprain of unspecified ligament of right ankle, initial encounter: Secondary | ICD-10-CM

## 2021-05-15 DIAGNOSIS — W1839XA Other fall on same level, initial encounter: Secondary | ICD-10-CM | POA: Insufficient documentation

## 2021-05-15 DIAGNOSIS — M25571 Pain in right ankle and joints of right foot: Secondary | ICD-10-CM | POA: Insufficient documentation

## 2021-05-15 DIAGNOSIS — S99911A Unspecified injury of right ankle, initial encounter: Secondary | ICD-10-CM | POA: Insufficient documentation

## 2021-05-15 MED ORDER — NAPROXEN 500 MG PO TABS: 500.0000 mg | ORAL_TABLET | Freq: Two times a day (BID) | ORAL | 0 refills | Status: DC

## 2021-05-15 NOTE — ED Triage Notes (Signed)
Pt states she fell and hurt her right ankle 10/31, pt states its been swollen and painful x2 weeks. Pedal pulses present bilaterally.

## 2021-05-15 NOTE — Progress Notes (Signed)
Orthopedic Tech Progress Note Patient Details:  Anna Dillon 11/05/1995 [id number]  Ortho Devices Type of Ortho Device: Ankle Air splint, Crutches Ortho Device/Splint Location: right Ortho Device/Splint Interventions: Application   Post Interventions Patient Tolerated: Well Instructions Provided: Care of device  Saul Fordyce 05/15/2021, 1:32 PM

## 2021-05-15 NOTE — Discharge Instructions (Addendum)
You have a right ankle sprain.  Your ankle x-ray was without fracture.  Your exam was reassuring.  We have placed you in a Aircast, and gave you crutches to limit weightbearing.  I have sent in naproxen to your pharmacy.  I have included orthopedic follow-up information above.  Please call them to schedule a clinic follow-up appointment.

## 2021-05-15 NOTE — ED Provider Notes (Signed)
[facility]-EMERGENCY DEPT Provider Note   CSN: [id number] Arrival date & time: 05/15/21  1142     History No chief complaint on file.   Anna Dillon is a 25 y.o. female.  25 year old female presents today for evaluation of right ankle pain following inversion ankle injury that occurred on 10/31.  Patient reports since then she has sparingly used.  He has been able to bear weight but with moderate amount of pain.  She denies any other injury.  He was wearing close and prep and following at night.  She has not taken any medicines over-the-counter for this.  The history is provided by the patient. No language interpreter was used.      Past Medical History:  Diagnosis Date   Migraine     There are no problems to display for this patient.   Past Surgical History:  Procedure Laterality Date   TONSILLECTOMY  10/2015     OB History   No obstetric history on file.     History reviewed. No pertinent family history.  Social History   Tobacco Use   Smoking status: Never   Smokeless tobacco: Never  Vaping Use   Vaping Use: Never used  Substance Use Topics   Alcohol use: Not Currently   Drug use: No    Home Medications Prior to Admission medications   Medication Sig Start Date End Date Taking? Authorizing Provider  acetaminophen (TYLENOL) 500 MG tablet Take 500 mg by mouth every 6 (six) hours as needed.    [provider]  diphenhydrAMINE (BENADRYL) 25 MG tablet Take 1 tablet (25 mg total) by mouth every 6 (six) hours. 10/26/17   Charlynne Pander, MD  hydrocortisone 2.5 % ointment Apply topically 2 (two) times daily. 10/26/17   Charlynne Pander, MD  ibuprofen (ADVIL,MOTRIN) 800 MG tablet Take 1 tablet (800 mg total) by mouth 3 (three) times daily. 10/29/17   Palumbo, April, MD  metoCLOPramide (REGLAN) 10 MG tablet Take 1 tablet (10 mg total) by mouth every 6 (six) hours as needed for nausea. 11/30/19   Minna Antis, MD  omeprazole  (PRILOSEC) 20 MG capsule Take 1 capsule (20 mg total) by mouth daily. 01/09/19   Georgetta Haber, NP  ondansetron (ZOFRAN) 4 MG tablet Take 1 tablet (4 mg total) by mouth every 6 (six) hours. 02/21/21   Curatolo, Adam, DO  polyethylene glycol powder (GLYCOLAX/MIRALAX) 17 GM/SCOOP powder Take 17 g by mouth daily. 01/09/19   Georgetta Haber, NP  famotidine (PEPCID) 20 MG tablet Take 1 tablet (20 mg total) by mouth 2 (two) times daily. 10/29/17 01/09/19  Palumbo, April, MD    Allergies    Augmentin [amoxicillin-pot clavulanate]  Review of Systems   Review of Systems  Musculoskeletal:  Positive for arthralgias and joint swelling. Negative for gait problem.  Skin:  Negative for wound.  Neurological:  Negative for weakness.   Physical Exam Updated Vital Signs BP 127/81   Pulse 77   Temp 98.1 F (36.7 C) (Oral)   Resp 18   SpO2 99%   Physical Exam Vitals and nursing note reviewed.  Constitutional:      General: She is not in acute distress.    Appearance: Normal appearance. She is not ill-appearing.  HENT:     Head: Normocephalic and atraumatic.     Nose: Nose normal.  Eyes:     Conjunctiva/sclera: Conjunctivae normal.  Pulmonary:     Effort: Pulmonary effort is normal. No respiratory distress.  Musculoskeletal:        General: No deformity.     Comments: Patient with minimal swelling to the right lateral ankle understanding.  Tenderness palpation present over the right lateral malleoli.  Full range of motion present in the right ankle.  2+ DP pulse present.  Sensation intact.  Range of motion in all digits present.  Without tenderness to palpation over tib-fib or ankle.  Skin:    Findings: No rash.  Neurological:     Mental Status: She is alert.    ED Results / Procedures / Treatments   Labs (all labs ordered are listed, but only abnormal results are displayed) Labs Reviewed - No data to display  EKG None  Radiology DG Ankle Complete Right  Result Date:  05/15/2021 CLINICAL DATA:  Right ankle pain and swelling, fall on 05/01/2021 EXAM: RIGHT ANKLE - COMPLETE 3+ VIEW COMPARISON:  None. FINDINGS: There is no evidence of fracture, dislocation, or joint effusion. There is no evidence of arthropathy or other focal bone abnormality. Soft tissues are unremarkable. IMPRESSION: Negative. Electronically Signed   By: Duanne Guess D.O.   On: 05/15/2021 12:25    Procedures Procedures   Medications Ordered in ED Medications - No data to display  ED Course  I have reviewed the triage vital signs and the nursing notes.  Pertinent labs & imaging results that were available during my care of the patient were reviewed by me and considered in my medical decision making (see chart for details).    MDM Rules/Calculators/A&P                           25 year old female presents with right ankle swelling and pain of 2-week duration.  Patient suffered an inversion ankle injury on following.  Patient's exam is reassuring.  Will place in Aircast, provide crutches, and referral to orthopedics.  Patient voices understanding and is in agreement with plan.  Final Clinical Impression(s) / ED Diagnoses Final diagnoses:  None    Rx / DC Orders ED Discharge Orders     None        Marita Kansas, PA-C 05/15/21 1317    Cathren Laine, MD 05/15/21 [phone]

## 2021-05-26 ENCOUNTER — Encounter (HOSPITAL_COMMUNITY): Payer: Self-pay

## 2021-05-26 ENCOUNTER — Other Ambulatory Visit: Payer: Self-pay

## 2021-05-26 ENCOUNTER — Emergency Department (HOSPITAL_COMMUNITY)
Admission: EM | Admit: 2021-05-26 | Discharge: 2021-05-26 | Disposition: A | Discharge status: Home / Self Care | Payer: BC Managed Care – PPO | Attending: Emergency Medicine | Admitting: Emergency Medicine

## 2021-05-26 DIAGNOSIS — R197 Diarrhea, unspecified: Secondary | ICD-10-CM | POA: Insufficient documentation

## 2021-05-26 DIAGNOSIS — Z5321 Procedure and treatment not carried out due to patient leaving prior to being seen by health care provider: Secondary | ICD-10-CM | POA: Diagnosis not present

## 2021-05-26 DIAGNOSIS — R1110 Vomiting, unspecified: Secondary | ICD-10-CM | POA: Diagnosis present

## 2021-05-26 LAB — COMPREHENSIVE METABOLIC PANEL
ALT: 18 U/L (ref 0–44)
AST: 24 U/L (ref 15–41)
Albumin: 4.7 g/dL (ref 3.5–5.0)
Alkaline Phosphatase: 67 U/L (ref 38–126)
Anion gap: 9 (ref 5–15)
BUN: 14 mg/dL (ref 6–20)
CO2: 18 mmol/L — ABNORMAL LOW (ref 22–32)
Calcium: 9.7 mg/dL (ref 8.9–10.3)
Chloride: 112 mmol/L — ABNORMAL HIGH (ref 98–111)
Creatinine, Ser: 0.66 mg/dL (ref 0.44–1.00)
GFR, Estimated: >60 mL/min (ref >60)
Glucose, Bld: 102 mg/dL — ABNORMAL HIGH (ref 70–99)
Potassium: 4 mmol/L (ref 3.5–5.1)
Sodium: 139 mmol/L (ref 135–145)
Total Bilirubin: 0.7 mg/dL (ref 0.3–1.2)
Total Protein: 8.4 g/dL — ABNORMAL HIGH (ref 6.5–8.1)

## 2021-05-26 LAB — CBC
HCT: 45.2 % (ref 36.0–46.0)
Hemoglobin: 15.1 g/dL — ABNORMAL HIGH (ref 12.0–15.0)
MCH: 29.4 pg (ref 26.0–34.0)
MCHC: 33.4 g/dL (ref 30.0–36.0)
MCV: 88.1 fL (ref 80.0–100.0)
Platelets: 326 10*3/uL (ref 150–400)
RBC: 5.13 MIL/uL — ABNORMAL HIGH (ref 3.87–5.11)
RDW: 12.9 % (ref 11.5–15.5)
WBC: 13 10*3/uL — ABNORMAL HIGH (ref 4.0–10.5)
nRBC: 0 % (ref 0.0–0.2)

## 2021-05-26 LAB — LIPASE, BLOOD: Lipase: 31 U/L (ref 11–51)

## 2021-05-26 LAB — I-STAT BETA HCG BLOOD, ED (MC, WL, AP ONLY): I-stat hCG, quantitative: <5 m[IU]/mL (ref <5)

## 2021-05-26 MED ORDER — ONDANSETRON 4 MG PO TBDP
4.0000 mg | ORAL_TABLET | Freq: Once | ORAL | Status: AC
  Administered 2021-05-26: 4 mg via ORAL
  Filled 2021-05-26: qty 1

## 2021-05-26 NOTE — ED Triage Notes (Signed)
Patient reports 5 episodes of V/D today.

## 2021-05-26 NOTE — ED Provider Notes (Signed)
Emergency Medicine Provider Triage Evaluation Note  Anna Dillon , a 25 y.o. female  was evaluated in triage.  Pt complains of vomiting and diarrhea.  Began earlier today.  She has had greater than 6 episodes of NBNB emesis as well as diarrhea.  No melena or bright red per rectum.  No associate abdominal pain.  No urinary symptoms.  No recent antibiotics or travel.  Denies chance of pregnancy, no urinary complaints, no cough, congestion rhinorrhea.  No recent COVID or flu exposures  Review of Systems  Positive: Nausea, vomiting, diarrhea Negative: Fever, cough, abdominal pain  Physical Exam  BP 125/79 (BP Location: Left Arm)   Pulse 87   Temp 98 F (36.7 C) (Oral)   Resp 16   Ht 5' 7.5" (1.715 m)   Wt 77.1 kg   SpO2 99%   BMI 26.23 kg/m  Gen:   Awake, no distress   Resp:  Normal effort  MSK:   Moves extremities without difficulty  ABD:  Soft, non tender Other:    Medical Decision Making  Medically screening exam initiated at 3:44 PM.  Appropriate orders placed.  Anna Dillon was informed that the remainder of the evaluation will be completed by another provider, this initial triage assessment does not replace that evaluation, and the importance of remaining in the ED until their evaluation is complete.  Nausea, vomiting, diarrhea   Anna Dillon A, PA-C 05/26/21 1546    Terald Sleeper, MD 05/26/21 1616

## 2021-08-23 ENCOUNTER — Telehealth: Payer: Self-pay | Admitting: Internal Medicine

## 2021-08-23 NOTE — Telephone Encounter (Signed)
Scheduled appt per 2/20 staff msg from RN Kim. Pt is aware of appt date and time. Pt is aware to arrive 15 mins prior to appt time and to bring and updated insurance card. Pt is aware of appt location.

## 2021-09-02 LAB — LAB REPORT - SCANNED
A1c: 5.4
HM HIV Screening: NEGATIVE
HM Hepatitis Screen: NEGATIVE
TSH: 1.37 (ref 0.41–5.90)

## 2021-09-04 ENCOUNTER — Other Ambulatory Visit: Payer: Self-pay

## 2021-09-04 ENCOUNTER — Inpatient Hospital Stay: Payer: BC Managed Care – PPO | Attending: Internal Medicine | Admitting: Internal Medicine

## 2021-09-04 DIAGNOSIS — G930 Cerebral cysts: Secondary | ICD-10-CM

## 2021-09-04 DIAGNOSIS — G43709 Chronic migraine without aura, not intractable, without status migrainosus: Secondary | ICD-10-CM | POA: Diagnosis not present

## 2021-09-04 DIAGNOSIS — G43909 Migraine, unspecified, not intractable, without status migrainosus: Secondary | ICD-10-CM | POA: Insufficient documentation

## 2021-09-04 DIAGNOSIS — R253 Fasciculation: Secondary | ICD-10-CM | POA: Diagnosis not present

## 2021-09-04 MED ORDER — BACLOFEN 5 MG PO TABS: 5.0000 mg | ORAL_TABLET | Freq: Three times a day (TID) | ORAL | 0 refills | Status: DC | PRN

## 2021-09-04 MED ORDER — ACETAZOLAMIDE 250 MG PO TABS: 250.0000 mg | ORAL_TABLET | Freq: Two times a day (BID) | ORAL | 1 refills | Status: DC

## 2021-09-04 NOTE — Progress Notes (Signed)
[facility] at [facility] [address]  [place], [place] [address] [phone]   New Patient Evaluation  Date of Service: 09/04/21 Patient Name: Traci Gafford Patient MRN: [id number] Patient DOB: [birth year] Provider: Henreitta Leber, MD  Identifying Statement:  Shyteria Lewis is a 26 y.o. female with Chronic migraine without aura without status migrainosus, not intractable who presents for initial consultation and evaluation  Referring Provider: Wilmer Floor, MD [address] [place],  [place] [address]   History of Present Illness: The patient's records from the referring physician were obtained and reviewed and the patient interviewed to confirm this HPI.  Talley Casco presents today to review headache syndrome.  She describes "migraine" headaches; unilateral pressure with nausea/vomiting and photophobia.  These occur "every other week" and have been present for several years.  Previously tried Topiramate as prevention, but this was not helpful.  Currently doses Tylenol as needed or takes occupational rest.  In addition, in recent months she describes episodes of blurry vision affecting both eyes.  This lasts for "maybe a minute", comes "out of nowhere", she returns to normal shortly therafter.  Frequency is ~1 per week currently.  There is no headache associated with the visual impairment.  She denies morning headaches but does acknowledge reproduction of pain with bearing down or coughing.  Finally, she complains of "twitching" sensation in her left eyelid over the past week or so.   Medications: Current Outpatient Medications on File Prior to Visit  Medication Sig Dispense Refill   acetaminophen (TYLENOL) 500 MG tablet Take 500 mg by mouth every 6 (six) hours as needed.     diphenhydrAMINE (BENADRYL) 25 MG tablet Take 1 tablet (25 mg total) by mouth every 6 (six) hours. 20 tablet 0   hydrocortisone 2.5 % ointment Apply topically 2 (two) times  daily. 30 g 0   metoCLOPramide (REGLAN) 10 MG tablet Take 1 tablet (10 mg total) by mouth every 6 (six) hours as needed for nausea. 20 tablet 0   naproxen (NAPROSYN) 500 MG tablet Take 1 tablet (500 mg total) by mouth 2 (two) times daily. 30 tablet 0   omeprazole (PRILOSEC) 20 MG capsule Take 1 capsule (20 mg total) by mouth daily. 30 capsule 0   ondansetron (ZOFRAN) 4 MG tablet Take 1 tablet (4 mg total) by mouth every 6 (six) hours. 12 tablet 0   polyethylene glycol powder (GLYCOLAX/MIRALAX) 17 GM/SCOOP powder Take 17 g by mouth daily. 255 g 0   [DISCONTINUED] famotidine (PEPCID) 20 MG tablet Take 1 tablet (20 mg total) by mouth 2 (two) times daily. 30 tablet 0   No current facility-administered medications on file prior to visit.    Allergies:  Allergies  Allergen Reactions   Augmentin [Amoxicillin-Pot Clavulanate]     Excessive vomiting   Past Medical History:  Past Medical History:  Diagnosis Date   Migraine    Past Surgical History:  Past Surgical History:  Procedure Laterality Date   TONSILLECTOMY  10/2015   Social History:  Social History   Socioeconomic History   Marital status: Single    Spouse name: Not on file   Number of children: Not on file   Years of education: Not on file   Highest education level: Not on file  Occupational History   Not on file  Tobacco Use   Smoking status: Never   Smokeless tobacco: Never  Vaping Use   Vaping Use: Never used  Substance and Sexual Activity  Alcohol use: Not Currently   Drug use: No   Sexual activity: Not on file  Other Topics Concern   Not on file  Social History Narrative   Not on file   Social Determinants of Health   Financial Resource Strain: Not on file  Food Insecurity: Not on file  Transportation Needs: Not on file  Physical Activity: Not on file  Stress: Not on file  Social Connections: Not on file  Intimate Partner Violence: Not on file   Family History:  Family History  Problem Relation Age  of Onset   Healthy Mother    Healthy Father     Review of Systems: Constitutional: Doesn't report fevers, chills or abnormal weight loss Eyes: Doesn't report blurriness of vision Ears, nose, mouth, throat, and face: Doesn't report sore throat Respiratory: Doesn't report cough, dyspnea or wheezes Cardiovascular: Doesn't report palpitation, chest discomfort  Gastrointestinal:  Doesn't report nausea, constipation, diarrhea GU: Doesn't report incontinence Skin: Doesn't report skin rashes Neurological: Per HPI Musculoskeletal: Doesn't report joint pain Behavioral/Psych: Doesn't report anxiety  Physical Exam: Vitals:   09/04/21 0920  BP: 122/79  Pulse: 85  Resp: 18  SpO2: 100%   KPS: 90. General: Alert, cooperative, pleasant, in no acute distress Head: Normal EENT: No conjunctival injection or scleral icterus.  Lungs: Resp effort normal Cardiac: Regular rate Abdomen: Non-distended abdomen Skin: No rashes cyanosis or petechiae. Extremities: No clubbing or edema  Neurologic Exam: Mental Status: Awake, alert, attentive to examiner. Oriented to self and environment. Language is fluent with intact comprehension.  Cranial Nerves: Visual acuity is grossly normal. Visual fields are full. Extra-ocular movements intact. No ptosis. Face is symmetric Motor: Tone and bulk are normal. Power is full in both arms and legs. Reflexes are symmetric, no pathologic reflexes present.  Sensory: Intact to light touch Gait: Normal.   Labs: I have reviewed the data as listed    Component Value Date/Time   NA 139 05/26/2021 1612   K 4.0 05/26/2021 1612   CL 112 (H) 05/26/2021 1612   CO2 18 (L) 05/26/2021 1612   GLUCOSE 102 (H) 05/26/2021 1612   BUN 14 05/26/2021 1612   CREATININE 0.66 05/26/2021 1612   CALCIUM 9.7 05/26/2021 1612   PROT 8.4 (H) 05/26/2021 1612   ALBUMIN 4.7 05/26/2021 1612   AST 24 05/26/2021 1612   ALT 18 05/26/2021 1612   ALKPHOS 67 05/26/2021 1612   BILITOT 0.7  05/26/2021 1612   GFRNONAA >60 05/26/2021 1612   GFRAA >60 11/10/2019 0922   Lab Results  Component Value Date   WBC 13.0 (H) 05/26/2021   NEUTROABS 5.0 02/21/2021   HGB 15.1 (H) 05/26/2021   HCT 45.2 05/26/2021   MCV 88.1 05/26/2021   PLT 326 05/26/2021    Imaging: CLINICAL DATA:  Right occipital headache and intermittent dizziness   EXAM: CT HEAD WITHOUT CONTRAST   TECHNIQUE: Contiguous axial images were obtained from the base of the skull through the vertex without intravenous contrast.   COMPARISON:  April 08, 2018   FINDINGS: Brain: Ventricles and sulci are normal in size and configuration. There is a left posterior fossa arachnoid cyst, a benign finding, measuring 2.5 x 1.0 cm. No other evident mass. There is no hemorrhage. There is no subdural or epidural fluid collection. No midline shift.   Vascular: No hyperdense vessel.  No vascular calcifications evident.   Skull: Bony calvarium appears intact.   Sinuses/Orbits: Visualized paranasal sinuses are clear. Visualized orbits appear symmetric bilaterally.   Other:  Mastoid air cells are clear.   IMPRESSION: Benign posterior fossa arachnoid cyst just to the left of midline, stable. Study otherwise unremarkable. Note in particular that the brain parenchyma appears unremarkable. No hemorrhage.     Electronically Signed   By: Bretta Bang III M.D.   On: 11/30/2019 09:31   Assessment/Plan Chronic migraine without aura without status migrainosus, not intractable  Maddox Bratcher presents with clinical syndrome consistent with migraine without aura.  Component of transient visual obscurations, and reproducibility with coughing, bring up question of possible idiopathic intracranial hypertension.    We discussed a trial of diamox 250mg  BID given this concern and her clinical syndrome.  Also discussed keeping a headache diary to keep track of difference on/off medication.   In addition, will refer to Dr.  for dedicated opthalmologic evaluation.    For eyelid fasciculations and spasm, we recommended trial of Baclofen 5mg  q8 PRN.    We reviewed her CT head, stable arachnoid cyst.  No imaging follow up recommended.    We spent twenty additional minutes teaching regarding the natural history, biology, and historical experience in the treatment of neurologic disorders.   We appreciate the opportunity to participate in the care of Bari Leib.  We will touch base with her via phone in 1-2 months to evaluate response to these interventions.    All questions were answered. The patient knows to call the clinic with any problems, questions or concerns. No barriers to learning were detected.  The total time spent in the encounter was 45 minutes and more than 50% was on counseling and review of test results   , MD Medical Director of Neuro-Oncology [facility] [place] 09/04/21 9:16 AM

## 2021-09-12 ENCOUNTER — Telehealth: Payer: Self-pay | Admitting: *Deleted

## 2021-09-12 NOTE — Telephone Encounter (Signed)
Faxed referral to Dr Laruth Bouchard office.  Fax confirmation received. ? ?

## 2021-09-28 ENCOUNTER — Emergency Department (HOSPITAL_COMMUNITY)
Admission: EM | Admit: 2021-09-28 | Discharge: 2021-09-29 | Disposition: A | Discharge status: Home / Self Care | Payer: BC Managed Care – PPO | Attending: Emergency Medicine | Admitting: Emergency Medicine

## 2021-09-28 ENCOUNTER — Encounter (HOSPITAL_COMMUNITY): Payer: Self-pay | Admitting: Emergency Medicine

## 2021-09-28 DIAGNOSIS — U071 COVID-19: Secondary | ICD-10-CM | POA: Insufficient documentation

## 2021-09-28 DIAGNOSIS — E860 Dehydration: Secondary | ICD-10-CM | POA: Insufficient documentation

## 2021-09-28 DIAGNOSIS — R112 Nausea with vomiting, unspecified: Secondary | ICD-10-CM | POA: Diagnosis present

## 2021-09-28 MED ORDER — ONDANSETRON 8 MG PO TBDP
8.0000 mg | ORAL_TABLET | Freq: Once | ORAL | Status: AC
  Administered 2021-09-28: 8 mg via ORAL
  Filled 2021-09-28: qty 1

## 2021-09-28 NOTE — ED Triage Notes (Addendum)
Pt reports nausea since Tuesday (09/19/21) and headache on Wednesday (09/20/21). Tested positive for COVID on 09/20/21. States that she has vomited 4 times today. Chest is sore. Tearful in triage.  ?

## 2021-09-29 LAB — CBC WITH DIFFERENTIAL/PLATELET
Abs Immature Granulocytes: 0.06 10*3/uL (ref 0.00–0.07)
Basophils Absolute: 0 10*3/uL (ref 0.0–0.1)
Basophils Relative: 0 %
Eosinophils Absolute: 0.1 10*3/uL (ref 0.0–0.5)
Eosinophils Relative: 1 %
HCT: 45 % (ref 36.0–46.0)
Hemoglobin: 15.4 g/dL — ABNORMAL HIGH (ref 12.0–15.0)
Immature Granulocytes: 1 %
Lymphocytes Relative: 23 %
Lymphs Abs: 2.2 10*3/uL (ref 0.7–4.0)
MCH: 29.6 pg (ref 26.0–34.0)
MCHC: 34.2 g/dL (ref 30.0–36.0)
MCV: 86.5 fL (ref 80.0–100.0)
Monocytes Absolute: 0.3 10*3/uL (ref 0.1–1.0)
Monocytes Relative: 3 %
Neutro Abs: 6.8 10*3/uL (ref 1.7–7.7)
Neutrophils Relative %: 72 %
Platelets: 270 10*3/uL (ref 150–400)
RBC: 5.2 MIL/uL — ABNORMAL HIGH (ref 3.87–5.11)
RDW: 12.3 % (ref 11.5–15.5)
WBC: 9.5 10*3/uL (ref 4.0–10.5)
nRBC: 0 % (ref 0.0–0.2)

## 2021-09-29 LAB — COMPREHENSIVE METABOLIC PANEL
ALT: 23 U/L (ref 0–44)
AST: 34 U/L (ref 15–41)
Albumin: 4.3 g/dL (ref 3.5–5.0)
Alkaline Phosphatase: 55 U/L (ref 38–126)
Anion gap: 9 (ref 5–15)
BUN: 8 mg/dL (ref 6–20)
CO2: 20 mmol/L — ABNORMAL LOW (ref 22–32)
Calcium: 9.2 mg/dL (ref 8.9–10.3)
Chloride: 108 mmol/L (ref 98–111)
Creatinine, Ser: 0.88 mg/dL (ref 0.44–1.00)
GFR, Estimated: >60 mL/min (ref >60)
Glucose, Bld: 92 mg/dL (ref 70–99)
Potassium: 4.2 mmol/L (ref 3.5–5.1)
Sodium: 137 mmol/L (ref 135–145)
Total Bilirubin: 1.3 mg/dL — ABNORMAL HIGH (ref 0.3–1.2)
Total Protein: 7.5 g/dL (ref 6.5–8.1)

## 2021-09-29 LAB — URINALYSIS, ROUTINE W REFLEX MICROSCOPIC
Bilirubin Urine: NEGATIVE
Glucose, UA: NEGATIVE mg/dL
Hgb urine dipstick: NEGATIVE
Ketones, ur: NEGATIVE mg/dL
Nitrite: NEGATIVE
Protein, ur: NEGATIVE mg/dL
Specific Gravity, Urine: 1.014 (ref 1.005–1.030)
pH: 6 (ref 5.0–8.0)

## 2021-09-29 LAB — PREGNANCY, URINE: Preg Test, Ur: NEGATIVE

## 2021-09-29 MED ORDER — ACETAMINOPHEN 325 MG PO TABS
650.0000 mg | ORAL_TABLET | Freq: Once | ORAL | Status: AC
Start: 2021-09-29 — End: 2021-09-29
  Administered 2021-09-29: 650 mg via ORAL
  Filled 2021-09-29: qty 2

## 2021-09-29 MED ORDER — LACTATED RINGERS IV BOLUS
1000.0000 mL | Freq: Once | INTRAVENOUS | Status: AC
  Administered 2021-09-29: 1000 mL via INTRAVENOUS

## 2021-09-29 MED ORDER — KETOROLAC TROMETHAMINE 30 MG/ML IJ SOLN
15.0000 mg | Freq: Once | INTRAMUSCULAR | Status: AC
Start: 2021-09-29 — End: 2021-09-29
  Administered 2021-09-29: 15 mg via INTRAVENOUS
  Filled 2021-09-29: qty 1

## 2021-09-29 MED ORDER — ONDANSETRON 8 MG PO TBDP: 8.0000 mg | ORAL_TABLET | Freq: Three times a day (TID) | ORAL | 0 refills | Status: DC | PRN

## 2021-09-29 MED ORDER — METOCLOPRAMIDE HCL 5 MG/ML IJ SOLN
10.0000 mg | Freq: Once | INTRAMUSCULAR | Status: AC
  Administered 2021-09-29: 10 mg via INTRAVENOUS
  Filled 2021-09-29: qty 2

## 2021-09-29 NOTE — ED Provider Notes (Signed)
?[facility]-EMERGENCY DEPT ?Provider Note ? ? ?CSN: [id number] ?Arrival date & time: 09/28/21  2305 ? ?  ? ?History ? ?Chief Complaint  ?Patient presents with  ? Nausea  ? ? ?Anna Dillon is a 26 y.o. female. ? ?The history is provided by the patient.  ?Patient presents for multiple complaints.  She reports that she feels like she is dying ? she reports that about a week ago she had a headache and had a home administered test positive for COVID.  Since that time her symptoms have progressed.  She reports she has had cough, felt short of breath and had some chest wall pain from coughing.  She reports she had diarrhea and over the past several hours has been having nausea and vomiting.  No fevers today. ?  ? ?Home Medications ?Prior to Admission medications   ?Medication Sig Start Date End Date Taking? Authorizing Provider  ?ondansetron (ZOFRAN-ODT) 8 MG disintegrating tablet Take 1 tablet (8 mg total) by mouth every 8 (eight) hours as needed. 09/29/21  Yes Zadie Rhine, MD  ?acetaminophen (TYLENOL) 500 MG tablet Take 500 mg by mouth every 6 (six) hours as needed.    [provider]  ?acetaZOLAMIDE (DIAMOX) 250 MG tablet Take 1 tablet (250 mg total) by mouth 2 (two) times daily. 09/04/21   Henreitta Leber, MD  ?Baclofen 5 MG TABS Take 5 mg by mouth every 8 (eight) hours as needed. 09/04/21   Henreitta Leber, MD  ?famotidine (PEPCID) 20 MG tablet Take 1 tablet (20 mg total) by mouth 2 (two) times daily. 10/29/17 01/09/19  Palumbo, April, MD  ?   ? ?Allergies    ?Augmentin [amoxicillin-pot clavulanate]   ? ?Review of Systems   ?Review of Systems  ?Constitutional:  Positive for fatigue.  ?Respiratory:  Positive for cough and shortness of breath.   ?Gastrointestinal:  Positive for diarrhea and vomiting.  ?Neurological:  Positive for headaches.  ? ?Physical Exam ?Updated Vital Signs ?BP 126/68   Pulse 87   Temp 97.9 ?F (36.6 ?C) (Oral)   Resp 16   Ht 1.702 m (5\' 7" )   SpO2 100%   BMI  30.76 kg/m?  ?Physical Exam ?CONSTITUTIONAL: Well developed/well nourished, no distress ?HEAD: Normocephalic/atraumatic ?EYES: EOMI/PERRL ?ENMT: Mask in place ?NECK: supple no meningeal signs ?SPINE/BACK:entire spine nontender ?CV: S1/S2 noted, no murmurs/rubs/gallops noted ?LUNGS: Lungs are clear to auscultation bilaterally, no apparent distress ?Chest-mild chest wall tenderness to palpation ?ABDOMEN: soft, nontender, no rebound or guarding, bowel sounds noted throughout abdomen ?GU:no cva tenderness ?NEURO: Pt is awake/alert/appropriate, moves all extremitiesx4.  No facial droop.   ?EXTREMITIES: pulses normal/equal, full ROM ?SKIN: warm, color normal ?PSYCH: no abnormalities of mood noted, alert and oriented to situation ? ?ED Results / Procedures / Treatments   ?Labs ?(all labs ordered are listed, but only abnormal results are displayed) ?Labs Reviewed  ?URINALYSIS, ROUTINE W REFLEX MICROSCOPIC - Abnormal; Notable for the following components:  ?    Result Value  ? APPearance HAZY (*)   ? Leukocytes,Ua TRACE (*)   ? Bacteria, UA MANY (*)   ? All other components within normal limits  ?CBC WITH DIFFERENTIAL/PLATELET - Abnormal; Notable for the following components:  ? RBC 5.20 (*)   ? Hemoglobin 15.4 (*)   ? All other components within normal limits  ?COMPREHENSIVE METABOLIC PANEL - Abnormal; Notable for the following components:  ? CO2 20 (*)   ? Total Bilirubin 1.3 (*)   ? All other components  within normal limits  ?PREGNANCY, URINE  ? ? ?EKG ?None ? ?Radiology ?No results found. ? ?Procedures ?Procedures  ? ? ?Medications Ordered in ED ?Medications  ?ondansetron (ZOFRAN-ODT) disintegrating tablet 8 mg (8 mg Oral Given 09/28/21 2355)  ?lactated ringers bolus 1,000 mL (0 mLs Intravenous Stopped 09/29/21 0206)  ?acetaminophen (TYLENOL) tablet 650 mg (650 mg Oral Given 09/29/21 0031)  ?metoCLOPramide (REGLAN) injection 10 mg (10 mg Intravenous Given 09/29/21 0210)  ?ketorolac (TORADOL) 30 MG/ML injection 15 mg (15 mg  Intravenous Given 09/29/21 0208)  ? ? ?ED Course/ Medical Decision Making/ A&P ?Clinical Course as of 09/29/21 0313  ?Fri Sep 29, 2021  ?0145 Labs overall reassuring, patient reports she now has a migraine.  Will treat headache and reassess [DW]  ?0938 Overall patient is improved.  She is awake and alert.  She is taking p.o. fluids.  Labs are overall reassuring.  Plan for discharge home.  Suspect viral illness as cause of vomiting diarrhea that triggered her migraine [DW]  ?  ?Clinical Course User Index ?[DW] Zadie Rhine, MD  ? ?                        ?Medical Decision Making ?Amount and/or Complexity of Data Reviewed ?Labs: ordered. ? ?Risk ?OTC drugs. ?Prescription drug management. ? ? ?This patient presents to the ED for concern of COVID-19, vomiting diarrhea, this involves an extensive number of treatment options, and is a complaint that carries with it a high risk of complications and morbidity.  The differential diagnosis includes gastroenteritis, gastritis, appendicitis, pregnancy ? ? ?Additional history obtained: ?Records reviewed  neurology notes reviewed patient with history of migraines ? ?Lab Tests: ?I Ordered, and personally interpreted labs.  The pertinent results include: Dehydration ? ? ? ? ?Medicines ordered and prescription drug management: ?I ordered medication including Zofran for nausea ?Reglan Toradol for headache ?Reevaluation of the patient after these medicines showed that the patient    improved ? ?Test Considered: ?Considered CT head, patient has history of migraines and similar to prior ? ? ? ?Reevaluation: ?After the interventions noted above, I reevaluated the patient and found that they have :improved ? ?Complexity of problems addressed: ?Patient?s presentation is most consistent with  acute complicated illness/injury requiring diagnostic workup ? ?Disposition: ?After consideration of the diagnostic results and the patient?s response to treatment,  ?I feel that the patent would  benefit from discharge   .  ? ? ? ? ? ? ? ? ? ?Final Clinical Impression(s) / ED Diagnoses ?Final diagnoses:  ?Dehydration  ?Nausea vomiting and diarrhea  ? ? ?Rx / DC Orders ?ED Discharge Orders   ? ?      Ordered  ?  ondansetron (ZOFRAN-ODT) 8 MG disintegrating tablet  Every 8 hours PRN       ? 09/29/21 0309  ? ?  ?  ? ?  ? ? ?  ?Zadie Rhine, MD ?09/29/21 [phone] ? ?

## 2021-10-30 ENCOUNTER — Inpatient Hospital Stay: Payer: BC Managed Care – PPO | Attending: Internal Medicine | Admitting: Internal Medicine

## 2021-12-10 IMAGING — CR DG ANKLE COMPLETE 3+V*R*
3 series · 3 of 3 positions shown · non-contrast
Comparison: None.

CLINICAL DATA: Right ankle pain and swelling, fall on 05/01/2021

EXAM:
RIGHT ANKLE - COMPLETE 3+ VIEW

[x ankle ap right]
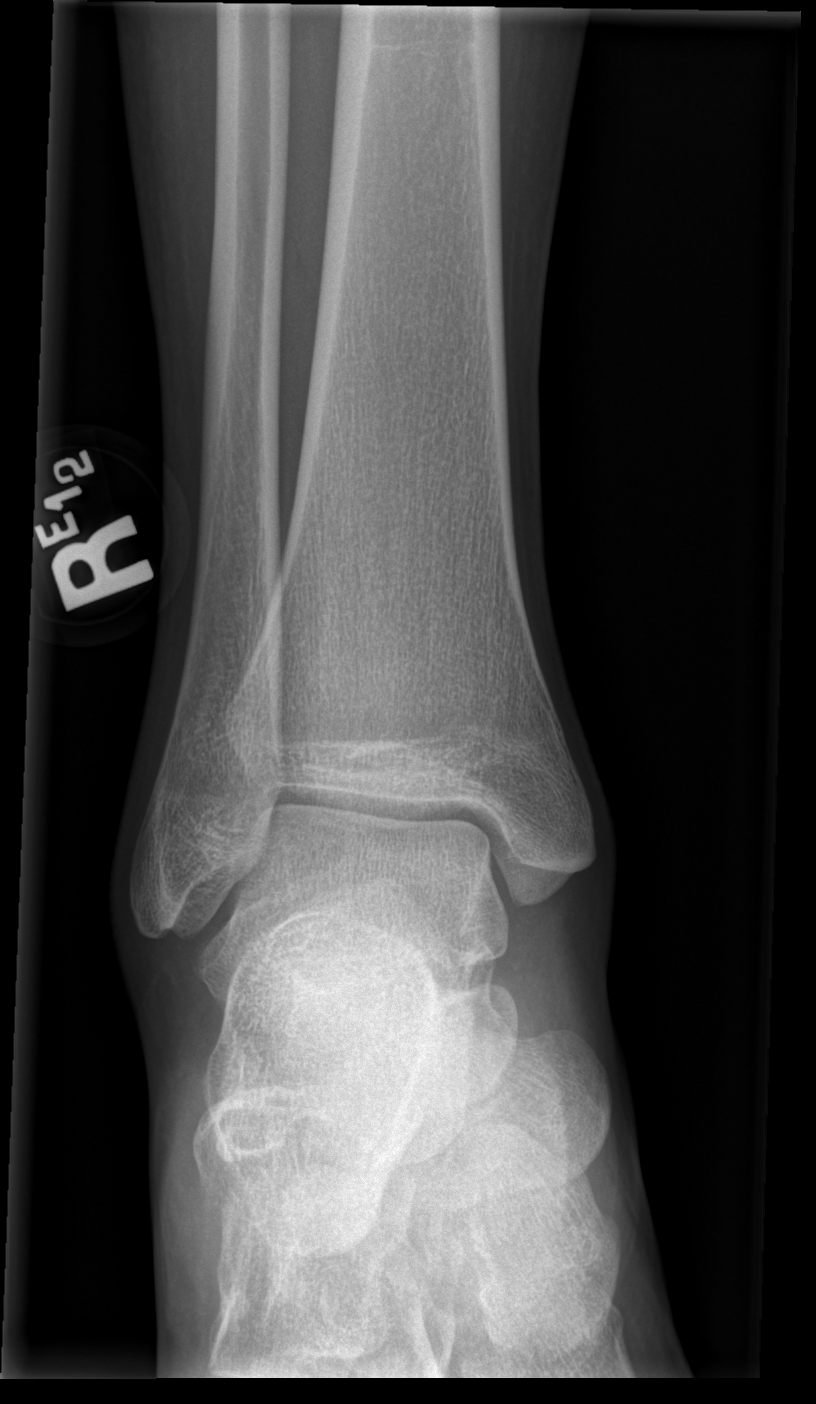

[x ankle obl right]
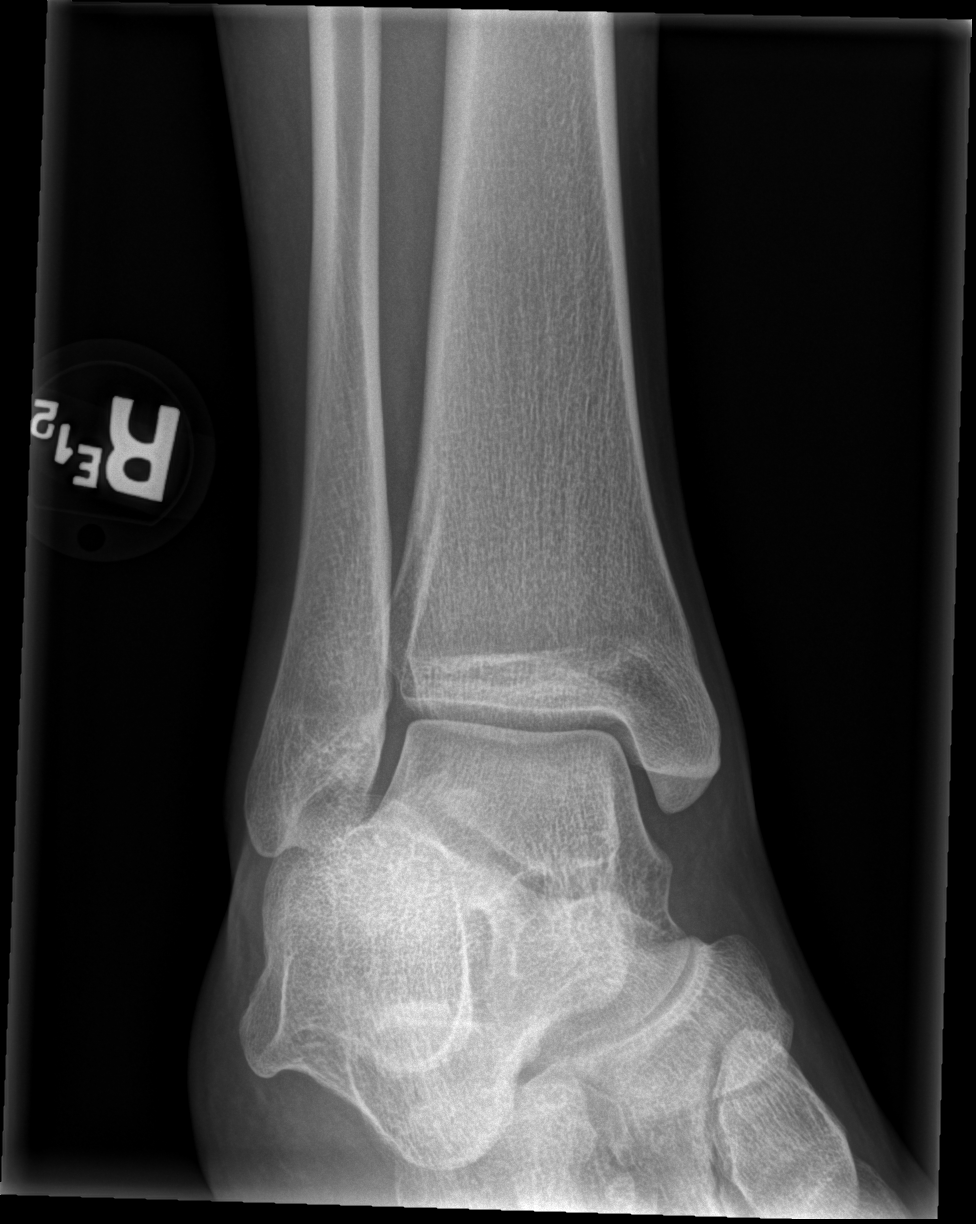

[x ankle lat right]
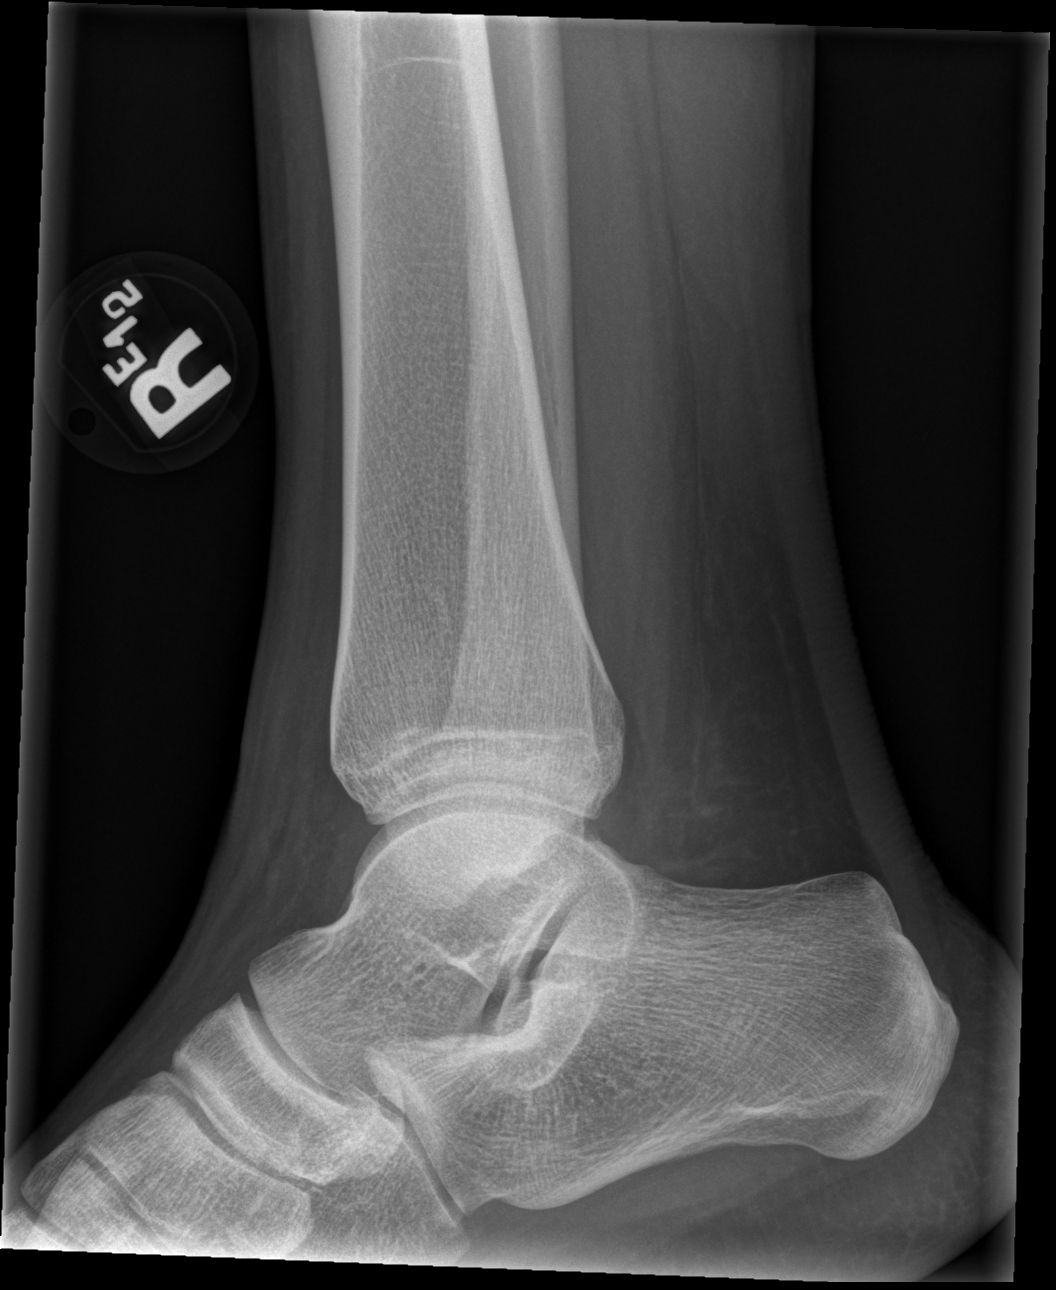

[3 of 3 positions shown; findings below may reference images not displayed]

FINDINGS: There is no evidence of fracture, dislocation, or joint effusion.
There is no evidence of arthropathy or other focal bone abnormality.
Soft tissues are unremarkable.
IMPRESSION: Negative.

## 2022-11-18 ENCOUNTER — Encounter (HOSPITAL_BASED_OUTPATIENT_CLINIC_OR_DEPARTMENT_OTHER): Payer: Self-pay

## 2022-11-18 ENCOUNTER — Other Ambulatory Visit: Payer: Self-pay

## 2022-11-18 ENCOUNTER — Emergency Department (HOSPITAL_BASED_OUTPATIENT_CLINIC_OR_DEPARTMENT_OTHER): Payer: Medicaid Other | Admitting: Radiology

## 2022-11-18 ENCOUNTER — Emergency Department (HOSPITAL_BASED_OUTPATIENT_CLINIC_OR_DEPARTMENT_OTHER)
Admission: EM | Admit: 2022-11-18 | Discharge: 2022-11-18 | Disposition: A | Discharge status: Home / Self Care | Payer: Medicaid Other | Attending: Emergency Medicine | Admitting: Emergency Medicine

## 2022-11-18 DIAGNOSIS — Z77120 Contact with and (suspected) exposure to mold (toxic): Secondary | ICD-10-CM | POA: Diagnosis not present

## 2022-11-18 DIAGNOSIS — J45909 Unspecified asthma, uncomplicated: Secondary | ICD-10-CM | POA: Insufficient documentation

## 2022-11-18 DIAGNOSIS — R0981 Nasal congestion: Secondary | ICD-10-CM | POA: Diagnosis present

## 2022-11-18 HISTORY — DX: Unspecified asthma, uncomplicated: J45.909

## 2022-11-18 LAB — GROUP A STREP BY PCR: Group A Strep by PCR: NOT DETECTED

## 2022-11-18 MED ORDER — PREDNISONE 50 MG PO TABS: ORAL_TABLET | ORAL | 0 refills | Status: DC

## 2022-11-18 NOTE — ED Triage Notes (Signed)
Patient here POV from Home.  Endorses Congestion that is present mostly at night. Mostly dry Cough as well at night. Sore Throat noted as well. Present for 4-5 Days. Noted Mold recently in her living area. No Known Fevers.   NAD Noted during Triage. A&Ox4. Gcs 15. Ambulatory.

## 2022-11-18 NOTE — ED Provider Notes (Signed)
[place] EMERGENCY DEPARTMENT AT [facility] Provider Note   CSN: [id number] Arrival date & time: 11/18/22  1431     History  Chief Complaint  Patient presents with   Congestion    Anna Dillon is a 27 y.o. female.  Pt complains of a cough and congestion after having a water leak in her apartment.  Pt reports mold on the walls.  Pt has been diagnosed with asthma.  Pt reports she feels better when she gets out of her apartment.  Pt reports feeling bad this am.  Pt denies any fever or chills.    The history is provided by the patient. No language interpreter was used.       Home Medications Prior to Admission medications   Medication Sig Start Date End Date Taking? Authorizing Provider  acetaminophen (TYLENOL) 500 MG tablet Take 500 mg by mouth every 6 (six) hours as needed.    [provider]  acetaZOLAMIDE (DIAMOX) 250 MG tablet Take 1 tablet (250 mg total) by mouth 2 (two) times daily. 09/04/21   Vaslow, Georgeanna Lea, MD  Baclofen 5 MG TABS Take 5 mg by mouth every 8 (eight) hours as needed. 09/04/21   Vaslow, Georgeanna Lea, MD  ondansetron (ZOFRAN-ODT) 8 MG disintegrating tablet Take 1 tablet (8 mg total) by mouth every 8 (eight) hours as needed. 09/29/21   Zadie Rhine, MD  famotidine (PEPCID) 20 MG tablet Take 1 tablet (20 mg total) by mouth 2 (two) times daily. 10/29/17 01/09/19  Palumbo, April, MD      Allergies    Augmentin [amoxicillin-pot clavulanate] and Shellfish allergy    Review of Systems   Review of Systems  Constitutional:  Negative for chills and fever.  HENT:  Positive for rhinorrhea.   Respiratory:  Positive for cough.   All other systems reviewed and are negative.   Physical Exam Updated Vital Signs BP 132/79 (BP Location: Right Arm)   Pulse 88   Temp 97.8 F (36.6 C)   Resp 15   Ht 5\' 8"  (1.727 m)   Wt 82.2 kg   SpO2 100%   BMI 27.54 kg/m  Physical Exam Vitals and nursing note reviewed.  Constitutional:      Appearance: She is  well-developed.  HENT:     Head: Normocephalic.     Mouth/Throat:     Mouth: Mucous membranes are moist.  Eyes:     Extraocular Movements: Extraocular movements intact.     Pupils: Pupils are equal, round, and reactive to light.  Cardiovascular:     Rate and Rhythm: Normal rate.  Pulmonary:     Effort: Pulmonary effort is normal.  Abdominal:     General: There is no distension.  Musculoskeletal:        General: Normal range of motion.     Cervical back: Normal range of motion.  Skin:    General: Skin is warm.  Neurological:     General: No focal deficit present.     Mental Status: She is alert and oriented to person, place, and time.  Psychiatric:        Mood and Affect: Mood normal.     ED Results / Procedures / Treatments   Labs (all labs ordered are listed, but only abnormal results are displayed) Labs Reviewed  GROUP A STREP BY PCR    EKG None  Radiology DG Chest 2 View  Result Date: 11/18/2022 CLINICAL DATA:  Cough and congestion. EXAM: CHEST - 2 VIEW COMPARISON:  Two-view chest x-ray 10/28/2017. FINDINGS: The heart size and mediastinal contours are within normal limits. Both lungs are clear. The visualized skeletal structures are unremarkable. IMPRESSION: Negative two view chest x-ray. Electronically Signed   By: Marin Roberts M.D.   On: 11/18/2022 15:26    Procedures Procedures    Medications Ordered in ED Medications - No data to display  ED Course/ Medical Decision Making/ A&P                             Medical Decision Making Pt complains of mold exposure.  Pt using albuterol   Amount and/or Complexity of Data Reviewed Independent Historian: parent Labs: ordered. Decision-making details documented in ED Course.    Details: Strep is negative  Radiology: ordered and independent interpretation performed. Decision-making details documented in ED Course.    Details: Chest xray ordered reviewed and interpreted  no acute  change  Risk Prescription drug management.           Final Clinical Impression(s) / ED Diagnoses Final diagnoses:  Mild asthma without complication, unspecified whether persistent  Mold exposure    Rx / DC Orders ED Discharge Orders          Ordered    predniSONE (DELTASONE) 50 MG tablet        11/18/22 1659           An After Visit Summary was printed and given to the patient.    Elson Areas, [place] 11/18/22 1659    Terald Sleeper, MD 11/18/22 [phone]

## 2023-03-05 ENCOUNTER — Other Ambulatory Visit: Payer: Self-pay

## 2023-03-05 ENCOUNTER — Encounter: Payer: Self-pay | Admitting: Allergy & Immunology

## 2023-03-05 ENCOUNTER — Ambulatory Visit: Payer: Medicaid Other | Admitting: Allergy & Immunology

## 2023-03-05 VITALS — BP 122/84 | HR 80 | Temp 97.9°F | Resp 18 | Ht 67.72 in | Wt 184.4 lb

## 2023-03-05 DIAGNOSIS — J4520 Mild intermittent asthma, uncomplicated: Secondary | ICD-10-CM | POA: Diagnosis not present

## 2023-03-05 DIAGNOSIS — R1111 Vomiting without nausea: Secondary | ICD-10-CM | POA: Diagnosis not present

## 2023-03-05 DIAGNOSIS — R208 Other disturbances of skin sensation: Secondary | ICD-10-CM

## 2023-03-05 DIAGNOSIS — J302 Other seasonal allergic rhinitis: Secondary | ICD-10-CM | POA: Diagnosis not present

## 2023-03-05 DIAGNOSIS — J3089 Other allergic rhinitis: Secondary | ICD-10-CM

## 2023-03-05 DIAGNOSIS — T7800XA Anaphylactic reaction due to unspecified food, initial encounter: Secondary | ICD-10-CM | POA: Insufficient documentation

## 2023-03-05 MED ORDER — AZELASTINE HCL 0.1 % NA SOLN: 2.0000 | Freq: Two times a day (BID) | NASAL | 1 refills | Status: DC | PRN

## 2023-03-05 MED ORDER — MONTELUKAST SODIUM 10 MG PO TABS: 10.0000 mg | ORAL_TABLET | Freq: Every day | ORAL | 1 refills | Status: DC

## 2023-03-05 MED ORDER — LEVOCETIRIZINE DIHYDROCHLORIDE 5 MG PO TABS: 5.0000 mg | ORAL_TABLET | Freq: Every evening | ORAL | 1 refills | Status: DC

## 2023-03-05 MED ORDER — FLUTICASONE PROPIONATE 50 MCG/ACT NA SUSP: 2.0000 | Freq: Two times a day (BID) | NASAL | 1 refills | Status: DC | PRN

## 2023-03-05 NOTE — Patient Instructions (Addendum)
1. Mild intermittent asthma, uncomplicated - Lung testing looked good today. - We are not going to make any medication changes at this point in time. - I do not think that we need a daily controller medication. - Spacer use reviewed. - Daily controller medication(s): NOTHING - Prior to physical activity: albuterol 2 puffs 10-15 minutes before physical activity. - Rescue medications: albuterol 4 puffs every 4-6 hours as needed - Asthma control goals:  * Full participation in all desired activities (may need albuterol before activity) * Albuterol use two time or less a week on average (not counting use with activity) * Cough interfering with sleep two time or less a month * Oral steroids no more than once a year * No hospitalizations  2. Concern for food allergies - Testing was positive to shrimp and shellfish mix as well as orange. - We are going to do blood work to confirm thaet these others were indeed negative.  - Emergency Action Plan provided. - EpiPen training provided.   3.Seasonal and perennial allergic rhinitis - Testing today showed: grasses, ragweed, weeds, trees, indoor molds, outdoor molds, dust mites, and cockroach - Copy of test results provided.  - Avoidance measures provided. - Start taking: Xyzal (levocetirizine) 5mg  tablet once daily, Singulair (montelukast) 10mg  daily, Flonase (fluticasone) one spray per nostril daily (AIM FOR EAR ON EACH SIDE), and Astelin (azelastine) 2 sprays per nostril 1-2 times daily as needed - Singulair can cause irritability and bad dreams, so beware of this.  - You can use an extra dose of the antihistamine, if needed, for breakthrough symptoms.  - Consider nasal saline rinses 1-2 times daily to remove allergens from the nasal cavities as well as help with mucous clearance (this is especially helpful to do before the nasal sprays are given) - Consider allergy shots as a means of long-term control. - Allergy shots "re-train" and "reset" the  immune system to ignore environmental allergens and decrease the resulting immune response to those allergens (sneezing, itchy watery eyes, runny nose, nasal congestion, etc).    - Allergy shots improve symptoms in 75-85% of patients.  - Call us if you decide to do this.   4. Return in about 3 months (around 06/04/2023). You can have the follow up appointment with Dr. Dellis Anes or a Nurse Practicioner (our Nurse Practitioners are excellent and always have Physician oversight!).    Please inform us of any Emergency Department visits, hospitalizations, or changes in symptoms. Call us before going to the ED for breathing or allergy symptoms since we might be able to fit you in for a sick visit. Feel free to contact us anytime with any questions, problems, or concerns.  It was a pleasure to meet you today!  Websites that have reliable patient information: 1. American Academy of Asthma, Allergy, and Immunology: www.aaaai.org 2. Food Allergy Research and Education (FARE): foodallergy.org 3. Mothers of Asthmatics: http://www.asthmacommunitynetwork.org 4. American College of Allergy, Asthma, and Immunology: www.acaai.org   COVID-19 Vaccine Information can be found at: [email or web address] For questions related to vaccine distribution or appointments, please email [email or web address] or call [phone].   We realize that you might be concerned about having an allergic reaction to the COVID19 vaccines. To help with that concern, WE ARE OFFERING THE COVID19 VACCINES IN OUR OFFICE! Ask the front desk for dates!     "Like" [place] on Facebook and Instagram for our latest updates!      A healthy democracy works best when [employer] participate! Make sure you are registered  to vote! If you have moved or changed any of your contact information, you will need to get this updated before voting! Scan the QR codes below to learn more!        Airborne Adult Perc - 03/05/23 1126     Time Antigen Placed 1126    Allergen Manufacturer Waynette Buttery    Location Back    Number of Test 55    1. Control-Buffer 50% Glycerol Negative    2. Control-Histamine 2+    3. Bahia Negative    4. [place] Southern Territ[place] Negative    5. Johnson Negative    6. Kentucky Blue Negative    7. Meadow Fescue Negative    8. Perennial Rye Negative    9. Timothy Negative    10. Ragweed Mix 3+    11. Cocklebur Negative    12. Plantain,  English Negative    13. Baccharis Negative    14. Dog Fennel Negative    15. Russian Thistle 3+    16. Lamb's Quarters 3+    17. Sheep Sorrell Negative    18. Rough Pigweed 3+    19. Marsh Elder, Rough Negative    20. Mugwort, Common Negative    21. Box, Elder 3+    22. Cedar, red 3+    23. Sweet Gum Negative    24. Pecan Pollen Negative    25. Pine Mix Negative    26. Walnut, Black Pollen Negative    27. Red Mulberry Negative    28. Ash Mix Negative    29. Birch Mix Negative    30. Beech American Negative    31. Cottonwood, Eastern 3+    32. Hickory, White 3+    33. Maple Mix Negative    34. Oak, [place] Mix Negative    35. Sycamore Eastern Negative    36. Alternaria Alternata 3+    37. Cladosporium Herbarum Negative    38. Aspergillus Mix 3+    39. Penicillium Mix Negative    40. Bipolaris Sorokiniana (Helminthosporium) Negative    41. Drechslera Spicifera (Curvularia) Negative    42. Mucor Plumbeus Negative    43. Fusarium Moniliforme Negative    44. Aureobasidium Pullulans (pullulara) Negative    45. Rhizopus Oryzae 3+    46. Botrytis Cinera 2+    47. Epicoccum Nigrum Negative    48. Phoma Betae 2+    49. Dust Mite Mix 4+    50. Cat Hair 10,000 BAU/ml Negative    51.  Dog Epithelia Negative    52. Mixed Feathers Negative    53. Horse Epithelia Negative    54. Cockroach, German 4+    55. Tobacco Leaf Negative             Intradermal - 03/05/23 1144     Time Antigen Placed 1145    Allergen Manufacturer  Greer    Location Arm    Number of Test 8    Control Negative    Bahia Negative    French [place] Territories 3+    Johnson Negative    7 Grass Negative    Mold 3 Negative    Cat Negative    Dog Negative             Food Adult Perc - 03/05/23 1100     Time Antigen Placed 1127    Allergen Manufacturer Waynette Buttery    Location Back     Control-buffer 50% Glycerol Negative    Control-Histamine 2+    4. Sesame Negative  8. Shellfish Mix --   5 x 8   9. Fish Mix Negative    18. Trout Negative    19. Tuna Negative    20. Salmon Negative    21. Flounder Negative    22. Codfish Negative    23. Shrimp --   5 x 10   24. Crab Negative    25. Lobster Negative    26. Oyster Negative    27. Scallops Negative    38. Tomato Negative    47. Onion Negative    55. Orange  --   4 x 8   57. Banana Negative    64. Watermelon Negative    65. Pineapple Negative             Reducing Pollen Exposure  The American Academy of Allergy, Asthma and Immunology suggests the following steps to reduce your exposure to pollen during allergy seasons.    Do not hang sheets or clothing out to dry; pollen may collect on these items. Do not mow lawns or spend time around freshly cut grass; mowing stirs up pollen. Keep windows closed at night.  Keep car windows closed while driving. Minimize morning activities outdoors, a time when pollen counts are usually at their highest. Stay indoors as much as possible when pollen counts or humidity is high and on windy days when pollen tends to remain in the air longer. Use air conditioning when possible.  Many air conditioners have filters that trap the pollen spores. Use a HEPA room air filter to remove pollen form the indoor air you breathe.  Control of Mold Allergen   Mold and fungi can grow on a variety of surfaces provided certain temperature and moisture conditions exist.  Outdoor molds grow on plants, decaying vegetation and soil.  The major outdoor mold, Alternaria and  Cladosporium, are found in very high numbers during hot and dry conditions.  Generally, a late Summer - Fall peak is seen for common outdoor fungal spores.  Rain will temporarily lower outdoor mold spore count, but counts rise rapidly when the rainy period ends.  The most important indoor molds are Aspergillus and Penicillium.  Dark, humid and poorly ventilated basements are ideal sites for mold growth.  The next most common sites of mold growth are the bathroom and the kitchen.  Outdoor (Seasonal) Mold Control  Positive outdoor molds via skin testing: Alternaria  Use air conditioning and keep windows closed Avoid exposure to decaying vegetation. Avoid leaf raking. Avoid grain handling. Consider wearing a face mask if working in moldy areas.    Indoor (Perennial) Mold Control   Positive indoor molds via skin testing: Aspergillus, Rhizopus, Botrytis, and Phoma  Maintain humidity below 50%. Clean washable surfaces with 5% bleach solution. Remove sources e.g. contaminated carpets.    Control of Dust Mite Allergen    Dust mites play a major role in allergic asthma and rhinitis.  They occur in environments with high humidity wherever human skin is found.  Dust mites absorb humidity from the atmosphere (ie, they do not drink) and feed on organic matter (including shed human and animal skin).  Dust mites are a microscopic type of insect that you cannot see with the naked eye.  High levels of dust mites have been detected from mattresses, pillows, carpets, upholstered furniture, bed covers, clothes, soft toys and any woven material.  The principal allergen of the dust mite is found in its feces.  A gram of dust may contain 1,000 mites and  250,000 fecal particles.  Mite antigen is easily measured in the air during house cleaning activities.  Dust mites do not bite and do not cause harm to humans, other than by triggering allergies/asthma.    Ways to decrease your exposure to dust mites in your  home:  Encase mattresses, box springs and pillows with a mite-impermeable barrier or cover   Wash sheets, blankets and drapes weekly in hot water (130 F) with detergent and dry them in a dryer on the hot setting.  Have the room cleaned frequently with a vacuum cleaner and a damp dust-mop.  For carpeting or rugs, vacuuming with a vacuum cleaner equipped with a high-efficiency particulate air (HEPA) filter.  The dust mite allergic individual should not be in a room which is being cleaned and should wait 1 hour after cleaning before going into the room. Do not sleep on upholstered furniture (eg, couches).   If possible removing carpeting, upholstered furniture and drapery from the home is ideal.  Horizontal blinds should be eliminated in the rooms where the person spends the most time (bedroom, study, television room).  Washable vinyl, roller-type shades are optimal. Remove all non-washable stuffed toys from the bedroom.  Wash stuffed toys weekly like sheets and blankets above.   Reduce indoor humidity to less than 50%.  Inexpensive humidity monitors can be purchased at most hardware stores.  Do not use a humidifier as can make the problem worse and are not recommended.  Control of Cockroach Allergen  Cockroach allergen has been identified as an important cause of acute attacks of asthma, especially in urban settings.  There are fifty-five species of cockroach that exist in the [place], however only three, the [place], [place] species produce allergen that can affect patients with Asthma.  Allergens can be obtained from fecal particles, egg casings and secretions from cockroaches.    Remove food sources. Reduce access to water. Seal access and entry points. Spray runways with 0.5-1% Diazinon or Chlorpyrifos Blow boric acid power under stoves and refrigerator. Place bait stations (hydramethylnon) at feeding sites.  Allergy Shots  Allergies are the result of a chain reaction that  starts in the immune system. Your immune system controls how your body defends itself. For instance, if you have an allergy to pollen, your immune system identifies pollen as an invader or allergen. Your immune system overreacts by producing antibodies called Immunoglobulin E (IgE). These antibodies travel to cells that release chemicals, causing an allergic reaction.  The concept behind allergy immunotherapy, whether it is received in the form of shots or tablets, is that the immune system can be desensitized to specific allergens that trigger allergy symptoms. Although it requires time and patience, the payback can be long-term relief. Allergy injections contain a dilute solution of those substances that you are allergic to based upon your skin testing and allergy history.   How Do Allergy Shots Work?  Allergy shots work much like a vaccine. Your body responds to injected amounts of a particular allergen given in increasing doses, eventually developing a resistance and tolerance to it. Allergy shots can lead to decreased, minimal or no allergy symptoms.  There generally are two phases: build-up and maintenance. Build-up often ranges from three to six months and involves receiving injections with increasing amounts of the allergens. The shots are typically given once or twice a week, though more rapid build-up schedules are sometimes used.  The maintenance phase begins when the most effective dose is reached. This dose is  different for each person, depending on how allergic you are and your response to the build-up injections. Once the maintenance dose is reached, there are longer periods between injections, typically two to four weeks.  Occasionally doctors give cortisone-type shots that can temporarily reduce allergy symptoms. These types of shots are different and should not be confused with allergy immunotherapy shots.  Who Can Be Treated with Allergy Shots?  Allergy shots may be a good  treatment approach for people with allergic rhinitis (hay fever), allergic asthma, conjunctivitis (eye allergy) or stinging insect allergy.   Before deciding to begin allergy shots, you should consider:   The length of allergy season and the severity of your symptoms  Whether medications and/or changes to your environment can control your symptoms  Your desire to avoid long-term medication use  Time: allergy immunotherapy requires a major time commitment  Cost: may vary depending on your insurance coverage  Allergy shots for children age 8 and older are effective and often well tolerated. They might prevent the onset of new allergen sensitivities or the progression to asthma.  Allergy shots are not started on patients who are pregnant but can be continued on patients who become pregnant while receiving them. In some patients with other medical conditions or who take certain common medications, allergy shots may be of risk. It is important to mention other medications you talk to your allergist.   What are the two types of build-ups offered:   RUSH or Rapid Desensitization -- one day of injections lasting from 8:30-4:30pm, injections every 1 hour.  Approximately half of the build-up process is completed in that one day.  The following week, normal build-up is resumed, and this entails ~16 visits either weekly or twice weekly, until reaching your "maintenance dose" which is continued weekly until eventually getting spaced out to every month for a duration of 3 to 5 years. The regular build-up appointments are nurse visits where the injections are administered, followed by required monitoring for 30 minutes.    Traditional build-up -- weekly visits for 6 -12 months until reaching "maintenance dose", then continue weekly until eventually spacing out to every 4 weeks as above. At these appointments, the injections are administered, followed by required monitoring for 30 minutes.     Either way is  acceptable, and both are equally effective. With the rush protocol, the advantage is that less time is spent here for injections overall AND you would also reach maintenance dosing faster (which is when the clinical benefit starts to become more apparent). Not everyone is a candidate for rapid desensitization.   IF we proceed with the RUSH protocol, there are premedications which must be taken the day before and the day after the rush only (this includes antihistamines, steroids, and Singulair).  After the rush day, no prednisone or Singulair is required, and we just recommend antihistamines taken on your injection day.  What Is An Estimate of the Costs?  If you are interested in starting allergy injections, please check with your insurance company about your coverage for both allergy vial sets and allergy injections.  Please do so prior to making the appointment to start injections.  The following are CPT codes to give to your insurance company. These are the amounts we BILL to the insurance company, but the amount YOU WILL PAY and WE RECEIVE IS SUBSTANTIALLY LESS and depends on the contracts we have with different insurance companies.   Amount Billed to Insurance One allergy vial set  CPT 95165   $  1200     Two allergy vial set  CPT 95165   $ 2400     Three allergy vial set  CPT 95165   $ 3600     One injection   CPT 95115   $ 35  Two injections   CPT 95117   $ 40 RUSH (Rapid Desensitization) CPT 95180 x 8 hours $500/hour  Regarding the allergy injections, your co-pay may or may not apply with each injection, so please confirm this with your insurance company. When you start allergy injections, 1 or 2 sets of vials are made based on your allergies.  Not all patients can be on one set of vials. A set of vials lasts 6 months to a year depending on how quickly you can proceed with your build-up of your allergy injections. Vials are personalized for each patient depending on their specific  allergens.  How often are allergy injection given during the build-up period?   Injections are given at least weekly during the build-up period until your maintenance dose is achieved. Per the doctor's discretion, you may have the option of getting allergy injections two times per week during the build-up period. However, there must be at least 48 hours between injections. The build-up period is usually completed within 6-12 months depending on your ability to schedule injections and for adjustments for reactions. When maintenance dose is reached, your injection schedule is gradually changed to every two weeks and later to every three weeks. Injections will then continue every 4 weeks. Usually, injections are continued for a total of 3-5 years.   When Will I Feel Better?  Some may experience decreased allergy symptoms during the build-up phase. For others, it may take as long as 12 months on the maintenance dose. If there is no improvement after a year of maintenance, your allergist will discuss other treatment options with you.  If you aren't responding to allergy shots, it may be because there is not enough dose of the allergen in your vaccine or there are missing allergens that were not identified during your allergy testing. Other reasons could be that there are high levels of the allergen in your environment or major exposure to non-allergic triggers like tobacco smoke.  What Is the Length of Treatment?  Once the maintenance dose is reached, allergy shots are generally continued for three to five years. The decision to stop should be discussed with your allergist at that time. Some people may experience a permanent reduction of allergy symptoms. Others may relapse and a longer course of allergy shots can be considered.  What Are the Possible Reactions?  The two types of adverse reactions that can occur with allergy shots are local and systemic. Common local reactions include very mild redness  and swelling at the injection site, which can happen immediately or several hours after. Report a delayed reaction from your last injection. These include arm swelling or runny nose, watery eyes or cough that occurs within 12-24 hours after injection. A systemic reaction, which is less common, affects the entire body or a particular body system. They are usually mild and typically respond quickly to medications. Signs include increased allergy symptoms such as sneezing, a stuffy nose or hives.   Rarely, a serious systemic reaction called anaphylaxis can develop. Symptoms include swelling in the throat, wheezing, a feeling of tightness in the chest, nausea or dizziness. Most serious systemic reactions develop within 30 minutes of allergy shots. This is why it is strongly recommended you wait  in your doctor's office for 30 minutes after your injections. Your allergist is trained to watch for reactions, and his or her staff is trained and equipped with the proper medications to identify and treat them.   Report to the nurse immediately if you experience any of the following symptoms: swelling, itching or redness of the skin, hives, watery eyes/nose, breathing difficulty, excessive sneezing, coughing, stomach pain, diarrhea, or light headedness. These symptoms may occur within 15-20 minutes after injection and may require medication.   Who Should Administer Allergy Shots?  The preferred location for receiving shots is your prescribing allergist's office. Injections can sometimes be given at another facility where the physician and staff are trained to recognize and treat reactions, and have received instructions by your prescribing allergist.  What if I am late for an injection?   Injection dose will be adjusted depending upon how many days or weeks you are late for your injection.   What if I am sick?   Please report any illness to the nurse before receiving injections. She may adjust your dose or  postpone injections depending on your symptoms. If you have fever, flu, sinus infection or chest congestion it is best to postpone allergy injections until you are better. Never get an allergy injection if your asthma is causing you problems. If your symptoms persist, seek out medical care to get your health problem under control.  What If I am or Become Pregnant:  Women that become pregnant should schedule an appointment with [facility] before receiving any further allergy injections.

## 2023-03-05 NOTE — Progress Notes (Signed)
NEW PATIENT  Date of Service/Encounter:  03/05/23  Consult requested by: Wilmer Floor, MD   Assessment:   Mild intermittent asthma, uncomplicated  Concern for food allergies - with testing positive to shrimp as well as orange  Seasonal and perennial allergic rhinitis (grasses, ragweed, weeds, trees, indoor molds, outdoor molds, dust mites, and cockroach)  History of exposure to indoor mold at a previous living situation  Plan/Recommendations:   1. Mild intermittent asthma, uncomplicated - Lung testing looked good today. - We are not going to make any medication changes at this point in time. - I do not think that we need a daily controller medication. - Spacer use reviewed. - Daily controller medication(s): NOTHING - Prior to physical activity: albuterol 2 puffs 10-15 minutes before physical activity. - Rescue medications: albuterol 4 puffs every 4-6 hours as needed - Asthma control goals:  * Full participation in all desired activities (may need albuterol before activity) * Albuterol use two time or less a week on average (not counting use with activity) * Cough interfering with sleep two time or less a month * Oral steroids no more than once a year * No hospitalizations  2. Concern for food allergies - Testing was positive to shrimp and shellfish mix as well as orange. - We are going to do blood work to confirm thaet these others were indeed negative.  - Emergency Action Plan provided. - EpiPen training provided.   3.Seasonal and perennial allergic rhinitis - Testing today showed: grasses, ragweed, weeds, trees, indoor molds, outdoor molds, dust mites, and cockroach - Copy of test results provided.  - Avoidance measures provided. - Start taking: Xyzal (levocetirizine) 5mg  tablet once daily, Singulair (montelukast) 10mg  daily, Flonase (fluticasone) one spray per nostril daily (AIM FOR EAR ON EACH SIDE), and Astelin (azelastine) 2 sprays per nostril 1-2 times  daily as needed - Singulair can cause irritability and bad dreams, so beware of this.  - You can use an extra dose of the antihistamine, if needed, for breakthrough symptoms.  - Consider nasal saline rinses 1-2 times daily to remove allergens from the nasal cavities as well as help with mucous clearance (this is especially helpful to do before the nasal sprays are given) - Consider allergy shots as a means of long-term control. - Allergy shots "re-train" and "reset" the immune system to ignore environmental allergens and decrease the resulting immune response to those allergens (sneezing, itchy watery eyes, runny nose, nasal congestion, etc).    - Allergy shots improve symptoms in 75-85% of patients.  - Call us if you decide to do this.   4. Return in about 3 months (around 06/04/2023). You can have the follow up appointment with Dr. Dellis Anes or a Nurse Practicioner (our Nurse Practitioners are excellent and always have Physician oversight!).   This note in its entirety was forwarded to the Provider who requested this consultation.  Subjective:   Anna Dillon is a 27 y.o. 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  Anna Dillon was referred by Wilmer Floor, MD.     Anna Dillon is a 27 y.o. female presenting for an evaluation of allergies, asthma, and food allergies  .   Asthma/Respiratory Symptom History: She has respiratory problems around mold. She was living with mold in early 2024, but she is out of the mold apartment. She got out of there in June 2024 but she became concerned.  She had COVID19 one month ago and she thinks that she used her albuterol at that time PRN. She has had the same albuterol inhaler that she got earlier in the year.   Allergic Rhinitis Symptom History: She does have allergic rhinitis symptoms  during the pollen season. But she has worsening symptoms in the spring time for sure, but she has symptoms throughout the year. She does wake up with sneezing throughout the entire year. She was on Claritin which worked for a time. She has since changed to Allegra but this does not seem to do the trick. She does use nasal steroids as needed. She does not like the sensation.  She has never been tested. She grew up in [place]. [place]. She moved here in 2016. 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   Food Allergy Symptom History: Certain foods including tomatoes make her mouth burn. She also reports that sesame chicken made her face break out. She had vomiting with lobster and crab exposure. She can eat fin fish without a problem. It is only a problem with the shellfish.    Skin Symptom History: She does have a history of eczema which has been fairly well controlled. She went on a cruise to [place] and did an excursion. But something caused it to flare up. She also reports that something bit her last week.  She has bruising from mosquito bites.   She was working for [employer] for [employer]. She quit in June 2024. 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   Otherwise, there is no history of other atopic diseases, including drug allergies, stinging insect allergies, or contact dermatitis. There is no significant infectious history. Vaccinations are up to date.    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 If you have any questions, ask your nurse or doctor.          acetaminophen 500 MG tablet Commonly known as: TYLENOL Take 500 mg by mouth every 6 (six) hours as needed.   acetaZOLAMIDE 250 MG tablet Commonly known as: DIAMOX Take 1 tablet (250 mg total) by mouth 2 (two) times daily.   albuterol 108 (90 Base) MCG/ACT inhaler Commonly known as: VENTOLIN HFA Inhale 2 puffs  into the lungs every 4 (four) hours as needed for shortness of breath or wheezing.   Baclofen 5 MG Tabs Take 5 mg by mouth every 8 (eight) hours as needed.   ondansetron 8 MG disintegrating tablet Commonly known as: ZOFRAN-ODT Take 1 tablet (8 mg total) by mouth every 8 (eight) hours as needed.   predniSONE 50 MG tablet Commonly known as: DELTASONE One tablet a day for 5 days        Birth History: non-contributory  Developmental History: non-contributory  Past Surgical History: Past Surgical History:  Procedure Laterality Date   TONSILLECTOMY  10/2015     Family History: Family History  Problem Relation Age of Onset   Healthy Mother    Healthy Father      Social History: Anji lives at home in a [place].  There is vinyl throughout the home.  She has gas and electric heating with central cooling.  There are no animals inside or outside of the home.  She is not a smoker.  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  She does not have any fume, chemical, or dust exposure.  There is no HEPA filter in the home.  She  does not live an interstate or industrial area.   Review of systems otherwise negative other than that mentioned in the HPI.    Objective:   Blood pressure 122/84, pulse 80, temperature 97.9 F (36.6 C), resp. rate 18, height 5' 7.72" (1.72 m), weight 184 lb 6.4 oz (83.6 kg), SpO2 100%. Body mass index is 28.27 kg/m.     Physical Exam Constitutional:      Appearance: She is well-developed.  HENT:     Head: Normocephalic and atraumatic.     Right Ear: Tympanic membrane, ear canal and external ear normal. No drainage, swelling or tenderness. Tympanic membrane is not injected, scarred, erythematous, retracted or bulging.     Left Ear: Tympanic membrane, ear canal and external ear normal. No drainage, swelling or tenderness. Tympanic membrane is not injected, scarred, erythematous, retracted or bulging.     Nose: No nasal deformity, septal deviation, mucosal edema or rhinorrhea.     Right Sinus: No maxillary sinus tenderness or frontal sinus tenderness.     Left Sinus: No maxillary sinus tenderness or frontal sinus tenderness.     Mouth/Throat:     Mouth: Mucous membranes are not pale and not dry.     Pharynx: Uvula midline.  Eyes:     General:        Right eye: No discharge.        Left eye: No discharge.     Conjunctiva/sclera: Conjunctivae normal.     Right eye: Right conjunctiva is not injected. No chemosis.    Left eye: Left conjunctiva is not injected. No chemosis.    Pupils: Pupils are equal, round, and reactive to light.  Cardiovascular:     Rate and Rhythm: Normal rate and regular rhythm.     Heart sounds: Normal heart sounds.  Pulmonary:     Effort: Pulmonary effort is normal. No tachypnea, accessory muscle usage or respiratory distress.     Breath sounds: Normal breath sounds. No wheezing, rhonchi or rales.  Chest:     Chest wall: No tenderness.  Abdominal:     Tenderness: There is no abdominal tenderness. There is no guarding or rebound.  Lymphadenopathy:      Head:     Right side of head: No submandibular,  tonsillar or occipital adenopathy.     Left side of head: No submandibular, tonsillar or occipital adenopathy.     Cervical: No cervical adenopathy.  Skin:    Coloration: Skin is not pale.     Findings: No abrasion, erythema, petechiae or rash. Rash is not papular, urticarial or vesicular.  Neurological:     Mental Status: She is alert.      Diagnostic studies:    Spirometry: results normal (FEV1: 3.09/100%, FVC: 3.47/96%, FEV1/FVC: 89%).    Spirometry consistent with normal pattern.   Allergy Studies:    Airborne Adult Perc - 03/05/23 1126     Time Antigen Placed 1126    Allergen Manufacturer Waynette Buttery    Location Back    Number of Test 55    1. Control-Buffer 50% Glycerol Negative    2. Control-Histamine 2+    3. Bahia Negative    4. French Southern Territories Negative    5. Johnson Negative    6. Kentucky Blue Negative    7. Meadow Fescue Negative    8. Perennial Rye Negative    9. Timothy Negative    10. Ragweed Mix 3+    11. Cocklebur Negative    12. Plantain,  English Negative    13. Baccharis Negative    14. Dog Fennel Negative    15. Russian Thistle 3+    16. Lamb's Quarters 3+    17. Sheep Sorrell Negative    18. Rough Pigweed 3+    19. Marsh Elder, Rough Negative    20. Mugwort, Common Negative    21. Box, Elder 3+    22. Cedar, red 3+    23. Sweet Gum Negative    24. Pecan Pollen Negative    25. Pine Mix Negative    26. Walnut, Black Pollen Negative    27. Red Mulberry Negative    28. Ash Mix Negative    29. Birch Mix Negative    30. Beech American Negative    31. Cottonwood, Eastern 3+    32. Hickory, White 3+    33. Maple Mix Negative    34. Oak, Guinea-Bissau Mix Negative    35. Sycamore Eastern Negative    36. Alternaria Alternata 3+    37. Cladosporium Herbarum Negative    38. Aspergillus Mix 3+    39. Penicillium Mix Negative    40. Bipolaris Sorokiniana (Helminthosporium) Negative    41. Drechslera Spicifera  (Curvularia) Negative    42. Mucor Plumbeus Negative    43. Fusarium Moniliforme Negative    44. Aureobasidium Pullulans (pullulara) Negative    45. Rhizopus Oryzae 3+    46. Botrytis Cinera 2+    47. Epicoccum Nigrum Negative    48. Phoma Betae 2+    49. Dust Mite Mix 4+    50. Cat Hair 10,000 BAU/ml Negative    51.  Dog Epithelia Negative    52. Mixed Feathers Negative    53. Horse Epithelia Negative    54. Cockroach, German 4+    55. Tobacco Leaf Negative             Intradermal - 03/05/23 1144     Time Antigen Placed 1145    Allergen Manufacturer Greer    Location Arm    Number of Test 8    Control Negative    Bahia Negative    French Southern Territories 3+    Johnson Negative    7 Grass Negative    Mold 3 Negative    Cat  Negative    Dog Negative             Food Adult Perc - 03/05/23 1100     Time Antigen Placed 1127    Allergen Manufacturer Waynette Buttery    Location Back     Control-buffer 50% Glycerol Negative    Control-Histamine 2+    4. Sesame Negative    8. Shellfish Mix --   5 x 8   9. Fish Mix Negative    18. Trout Negative    19. Tuna Negative    20. Salmon Negative    21. Flounder Negative    22. Codfish Negative    23. Shrimp --   5 x 10   24. Crab Negative    25. Lobster Negative    26. Oyster Negative    27. Scallops Negative    38. Tomato Negative    47. Onion Negative    55. Orange  --   4 x 8   57. Banana Negative    64. Watermelon Negative    65. Pineapple Negative             Allergy testing results were read and interpreted by myself, documented by clinical staff.         Malachi Bonds, MD [facility]

## 2023-03-08 LAB — ALLERGEN PROFILE, FOOD-FISH
Allergen Mackerel IgE: <0.1 kU/L
Allergen Salmon IgE: <0.1 kU/L
Allergen Trout IgE: <0.1 kU/L
Allergen Walley Pike IgE: <0.1 kU/L
Codfish IgE: <0.1 kU/L
Halibut IgE: <0.1 kU/L
Tuna: <0.1 kU/L

## 2023-03-08 LAB — ALLERGEN PROFILE, SHELLFISH
Clam IgE: <0.1 kU/L
F023-IgE Crab: 1.55 kU/L — AB
F080-IgE Lobster: 1.77 kU/L — AB
F290-IgE Oyster: <0.1 kU/L
Scallop IgE: <0.1 kU/L
Shrimp IgE: 2.01 kU/L — AB

## 2023-03-08 LAB — ALLERGEN, PINEAPPLE, F210: Pineapple IgE: <0.1 kU/L

## 2023-03-08 LAB — ALLERGEN BANANA: Allergen Banana IgE: <0.1 kU/L

## 2023-03-08 LAB — ALLERGEN, ONION, F48: Allergen Onion IgE: <0.1 kU/L

## 2023-03-08 LAB — ALLERGEN WATERMELON: Allergen Watermelon IgE: <0.1 kU/L

## 2023-03-08 LAB — ALLERGEN, TOMATO F25: Allergen Tomato, IgE: <0.1 kU/L

## 2023-03-10 LAB — ALLERGEN SESAME F10: Sesame Seed IgE: <0.1 kU/L

## 2023-05-09 ENCOUNTER — Emergency Department (HOSPITAL_BASED_OUTPATIENT_CLINIC_OR_DEPARTMENT_OTHER)
Admission: EM | Admit: 2023-05-09 | Discharge: 2023-05-09 | Disposition: A | Discharge status: Home / Self Care | Payer: Medicaid Other | Attending: Emergency Medicine | Admitting: Emergency Medicine

## 2023-05-09 ENCOUNTER — Other Ambulatory Visit: Payer: Self-pay

## 2023-05-09 DIAGNOSIS — J45909 Unspecified asthma, uncomplicated: Secondary | ICD-10-CM | POA: Diagnosis not present

## 2023-05-09 DIAGNOSIS — Z20822 Contact with and (suspected) exposure to covid-19: Secondary | ICD-10-CM | POA: Insufficient documentation

## 2023-05-09 DIAGNOSIS — J069 Acute upper respiratory infection, unspecified: Secondary | ICD-10-CM | POA: Insufficient documentation

## 2023-05-09 DIAGNOSIS — R0981 Nasal congestion: Secondary | ICD-10-CM | POA: Diagnosis present

## 2023-05-09 LAB — RESP PANEL BY RT-PCR (RSV, FLU A&B, COVID)  RVPGX2
Influenza A by PCR: NEGATIVE
Influenza B by PCR: NEGATIVE
Resp Syncytial Virus by PCR: NEGATIVE
SARS Coronavirus 2 by RT PCR: NEGATIVE

## 2023-05-09 LAB — GROUP A STREP BY PCR: Group A Strep by PCR: NOT DETECTED

## 2023-05-09 NOTE — ED Triage Notes (Signed)
Tuesday sore throat started. Now has nasal congestion. Cough developed today. Febrile yesterday.  Tonsils removed 2017.

## 2023-05-09 NOTE — ED Provider Notes (Signed)
[place] EMERGENCY DEPARTMENT AT [facility] Provider Note   CSN: [id number] Arrival date & time: 05/09/23  1451     History  Chief Complaint  Patient presents with   Nasal Congestion   Fever    Anna Dillon is a 27 y.o. female with PMHx asthma, eczema, migraine who presents to ED concerned for nasal congestion, cough, rhinorrhea, and sore throat that has been developing over the past 2 days. Hx of tonsillectomy 2017. Endorses a subjective fever yesterday. Endorses one episode of chest tightness today that resolved with her albuterol inhaler.   Denies dyspnea, nausea, vomiting, diarrhea, abdominal pain.    Fever      Home Medications Prior to Admission medications   Medication Sig Start Date End Date Taking? Authorizing Provider  albuterol (VENTOLIN HFA) 108 (90 Base) MCG/ACT inhaler Inhale 2 puffs into the lungs every 4 (four) hours as needed for shortness of breath or wheezing.    [provider]  azelastine (ASTELIN) 0.1 % nasal spray Place 2 sprays into both nostrils 2 (two) times daily as needed for rhinitis. Use in each nostril as directed 03/05/23   Alfonse Spruce, MD  fluticasone [facility]) 50 MCG/ACT nasal spray Place 2 sprays into both nostrils 2 (two) times daily as needed for allergies or rhinitis. 03/05/23   Alfonse Spruce, MD  levocetirizine (XYZAL) 5 MG tablet Take 1 tablet (5 mg total) by mouth every evening. 03/05/23   Alfonse Spruce, MD  montelukast (SINGULAIR) 10 MG tablet Take 1 tablet (10 mg total) by mouth at bedtime. 03/05/23 06/03/23  Alfonse Spruce, MD  famotidine (PEPCID) 20 MG tablet Take 1 tablet (20 mg total) by mouth 2 (two) times daily. 10/29/17 01/09/19  Palumbo, April, MD      Allergies    Augmentin [amoxicillin-pot clavulanate] and Shellfish allergy    Review of Systems   Review of Systems  Constitutional:  Positive for fever.    Physical Exam Updated Vital Signs BP (!) 128/91   Pulse 93   Temp  98.4 F (36.9 C) (Oral)   Resp 17   SpO2 99%  Physical Exam Vitals and nursing note reviewed.  Constitutional:      General: She is not in acute distress.    Appearance: She is not ill-appearing or toxic-appearing.  HENT:     Head: Normocephalic and atraumatic.     Comments: No pain to palpation of sinuses    Mouth/Throat:     Mouth: Mucous membranes are moist.     Pharynx: Oropharynx is clear. No oropharyngeal exudate or posterior oropharyngeal erythema.  Eyes:     General: No scleral icterus.       Right eye: No discharge.        Left eye: No discharge.     Conjunctiva/sclera: Conjunctivae normal.  Cardiovascular:     Rate and Rhythm: Normal rate and regular rhythm.     Pulses: Normal pulses.     Heart sounds: Normal heart sounds. No murmur heard. Pulmonary:     Effort: Pulmonary effort is normal. No respiratory distress.     Breath sounds: Normal breath sounds. No wheezing, rhonchi or rales.  Abdominal:     General: Abdomen is flat. Bowel sounds are normal. There is no distension.     Palpations: Abdomen is soft. There is no mass.     Tenderness: There is no abdominal tenderness.  Musculoskeletal:     Cervical back: Normal range of motion. No rigidity.  Right lower leg: No edema.     Left lower leg: No edema.  Skin:    General: Skin is warm and dry.     Findings: No rash.  Neurological:     General: No focal deficit present.     Mental Status: She is alert. Mental status is at baseline.  Psychiatric:        Mood and Affect: Mood normal.        Behavior: Behavior normal.     ED Results / Procedures / Treatments   Labs (all labs ordered are listed, but only abnormal results are displayed) Labs Reviewed  RESP PANEL BY RT-PCR (RSV, FLU A&B, COVID)  [id number]  GROUP A STREP BY PCR    EKG None  Radiology No results found.  Procedures Procedures    Medications Ordered in ED Medications - No data to display  ED Course/ Medical Decision Making/ A&P                                  Medical Decision Making   This patient presents to the ED for concern of congestion, sore throat, cough, this involves an extensive number of treatment options, and is a complaint that carries with it a high risk of complications and morbidity.  The differential diagnosis includes Flu/COVID/RSV, strep pharyngitis, sinusitis, peritonsillar abscess, retropharyngeal abscess, pneumonia, meningitis.   Co morbidities that complicate the patient evaluation  asthma, eczema, migraine    Additional history obtained:  Dr. Elpidio Galea PCP   Lab Tests:  I Ordered, and personally interpreted labs.  The pertinent results include:   -Respiratory Panel: negative -Strep POCT: negative  Problem List / ED Course / Critical interventions / Medication management  Patient presents to ED concerned for nasal congestion, rhinorrhea, cough, sore throat x 3 days.  Also endorses subjective fever last night.  Physical exam unremarkable.  Endorses history of tonsillectomy in 2017.  Patient afebrile with stable vitals. Respiratory panel negative.  Strep PCR negative. Shared results with patient.  Patient was educated on alternating between 650 mg Tylenol and 400 mg ibuprofen every 3 hours as needed for pain and the benefit that honey may provide for cough symptoms.  Recommended following up with PCP.  Patient was given return precautions and is stable for discharge at this time. Patient verbalized understanding of plan. I have reviewed the patients home medicines and have made adjustments as needed Discharged in good condition.   DDx: These are considered less likely due to history of present illness and physical exam findings -Peritonsillar/Retropharyngeal abscess: Patient denies dysphagia, dysphonia, and no throat/oral swelling appreciated -PNA: Lungs clear to auscultation bilaterally -Meningitis: patient's symptoms, vital signs, physical exam findings including lack of meningismus seem  grossly less consistent at this time -Strep pharyngitis: no tonsillar swelling/exudates appreciated; strep PCR negative -Sinusitis: Patient denies purulent sputum or facial pain   Social Determinants of Health:  none          Final Clinical Impression(s) / ED Diagnoses Final diagnoses:  Viral upper respiratory tract infection    Rx / DC Orders ED Discharge Orders     None         Dorthy Cooler, [place] 05/09/23 1830    Rozelle Logan, DO 05/10/23 0023

## 2023-05-09 NOTE — Discharge Instructions (Addendum)
Was a pleasure caring for you today.  Workup today was reassuring.  Please follow-up with primary care provider.  Seek emergency care if experiencing any new or worsening symptoms.  Alternating between 650 mg Tylenol and 400 mg Advil: The best way to alternate taking Acetaminophen (example Tylenol) and Ibuprofen (example Advil/Motrin) is to take them 3 hours apart. For example, if you take ibuprofen at 6 am you can then take Tylenol at 9 am. You can continue this regimen throughout the day, making sure you do not exceed the recommended maximum dose for each drug.

## 2023-06-06 ENCOUNTER — Ambulatory Visit: Payer: Medicaid Other | Admitting: Allergy & Immunology

## 2023-07-23 ENCOUNTER — Encounter: Payer: Self-pay | Admitting: Allergy & Immunology

## 2023-07-23 ENCOUNTER — Ambulatory Visit: Payer: Medicaid Other | Admitting: Allergy & Immunology

## 2023-07-23 ENCOUNTER — Other Ambulatory Visit: Payer: Self-pay

## 2023-07-23 VITALS — BP 124/60 | HR 86 | Temp 98.3°F | Resp 16 | Ht 67.91 in | Wt 181.6 lb

## 2023-07-23 DIAGNOSIS — J3089 Other allergic rhinitis: Secondary | ICD-10-CM

## 2023-07-23 DIAGNOSIS — J302 Other seasonal allergic rhinitis: Secondary | ICD-10-CM

## 2023-07-23 DIAGNOSIS — T7802XD Anaphylactic reaction due to shellfish (crustaceans), subsequent encounter: Secondary | ICD-10-CM | POA: Diagnosis not present

## 2023-07-23 DIAGNOSIS — J4520 Mild intermittent asthma, uncomplicated: Secondary | ICD-10-CM | POA: Diagnosis not present

## 2023-07-23 DIAGNOSIS — Z91013 Allergy to seafood: Secondary | ICD-10-CM

## 2023-07-23 MED ORDER — BUDESONIDE-FORMOTEROL FUMARATE 160-4.5 MCG/ACT IN AERO: 2.0000 | INHALATION_SPRAY | Freq: Two times a day (BID) | RESPIRATORY_TRACT | 5 refills | Status: DC

## 2023-07-23 MED ORDER — EPINEPHRINE 0.3 MG/0.3ML IJ SOAJ: 0.3000 mg | INTRAMUSCULAR | 1 refills | Status: DC | PRN

## 2023-07-23 NOTE — Patient Instructions (Addendum)
1. Mild intermittent asthma, uncomplicated - Lung testing not done today. - We are going to add on a daily controller medication to stay AHEAD of symptoms during the winter and spring seasons.  - Spacer sample and teaching provided.  - Daily controller medication(s): Symbicort 160/4.5 mcg two puffs twice daily during the winter and the spring. - Prior to physical activity: albuterol 2 puffs 10-15 minutes before physical activity. - Rescue medications: albuterol 4 puffs every 4-6 hours as needed - Asthma control goals:  * Full participation in all desired activities (may need albuterol before activity) * Albuterol use two time or less a week on average (not counting use with activity) * Cough interfering with sleep two time or less a month * Oral steroids no more than once a year * No hospitalizations  2. Concern for food allergies (shellfish, orange)  - Continue to avoid orange. - TRY to avoid shrimp (these levels were high on testing). - Emergency Action Plan updated.  - EpiPen training provided.  - We are trying to get an EpiPen of some sort covered by insurance.  - Information on Xolair provided to help prevent cross-contamination reactions.  - We are getting an IgE level to dose this if you decide to pursue this.   3.Seasonal and perennial allergic rhinitis - Testing at the last visit showed: grasses, ragweed, weeds, trees, indoor molds, outdoor molds, dust mites, and cockroach - Continue taking: Xyzal (levocetirizine) 5mg  tablet once daily, Singulair (montelukast) 10mg  daily, Flonase (fluticasone) one spray per nostril daily (AIM FOR EAR ON EACH SIDE), and Astelin (azelastine) 2 sprays per nostril 1-2 times daily as needed - Singulair can cause irritability and bad dreams, so beware of this.  - You can use an extra dose of the antihistamine, if needed, for breakthrough symptoms.  - Consider nasal saline rinses 1-2 times daily to remove allergens from the nasal cavities as well as  help with mucous clearance (this is especially helpful to do before the nasal sprays are given) - Consider allergy shots as a means of long-term control. - Allergy shots "re-train" and "reset" the immune system to ignore environmental allergens and decrease the resulting immune response to those allergens (sneezing, itchy watery eyes, runny nose, nasal congestion, etc).    - Allergy shots improve symptoms in 75-85% of patients.  - Consent signed today (two vials).  - This would help with your allergies (including postnasal drip) and your breathing.   4. Return in about 6 months (around 01/20/2024). You can have the follow up appointment with Dr. Dellis Anes or a Nurse Practicioner (our Nurse Practitioners are excellent and always have Physician oversight!).    Please inform us of any Emergency Department visits, hospitalizations, or changes in symptoms. Call us before going to the ED for breathing or allergy symptoms since we might be able to fit you in for a sick visit. Feel free to contact us anytime with any questions, problems, or concerns.  It was a pleasure to see you again today!  Websites that have reliable patient information: 1. American Academy of Asthma, Allergy, and Immunology: www.aaaai.org 2. Food Allergy Research and Education (FARE): foodallergy.org 3. Mothers of Asthmatics: http://www.asthmacommunitynetwork.org 4. American College of Allergy, Asthma, and Immunology: www.acaai.org      "Like" [place] on Facebook and Instagram for our latest updates!      A healthy democracy works best when [employer] participate! Make sure you are registered to vote! If you have moved or changed any of your contact information,  you will need to get this updated before voting! Scan the QR codes below to learn more!       Allergy Shots  Allergies are the result of a chain reaction that starts in the immune system. Your immune system controls how your body defends itself. For instance, if  you have an allergy to pollen, your immune system identifies pollen as an invader or allergen. Your immune system overreacts by producing antibodies called Immunoglobulin E (IgE). These antibodies travel to cells that release chemicals, causing an allergic reaction.  The concept behind allergy immunotherapy, whether it is received in the form of shots or tablets, is that the immune system can be desensitized to specific allergens that trigger allergy symptoms. Although it requires time and patience, the payback can be long-term relief. Allergy injections contain a dilute solution of those substances that you are allergic to based upon your skin testing and allergy history.   How Do Allergy Shots Work?  Allergy shots work much like a vaccine. Your body responds to injected amounts of a particular allergen given in increasing doses, eventually developing a resistance and tolerance to it. Allergy shots can lead to decreased, minimal or no allergy symptoms.  There generally are two phases: build-up and maintenance. Build-up often ranges from three to six months and involves receiving injections with increasing amounts of the allergens. The shots are typically given once or twice a week, though more rapid build-up schedules are sometimes used.  The maintenance phase begins when the most effective dose is reached. This dose is different for each person, depending on how allergic you are and your response to the build-up injections. Once the maintenance dose is reached, there are longer periods between injections, typically two to four weeks.  Occasionally doctors give cortisone-type shots that can temporarily reduce allergy symptoms. These types of shots are different and should not be confused with allergy immunotherapy shots.  Who Can Be Treated with Allergy Shots?  Allergy shots may be a good treatment approach for people with allergic rhinitis (hay fever), allergic asthma, conjunctivitis (eye allergy)  or stinging insect allergy.   Before deciding to begin allergy shots, you should consider:   The length of allergy season and the severity of your symptoms  Whether medications and/or changes to your environment can control your symptoms  Your desire to avoid long-term medication use  Time: allergy immunotherapy requires a major time commitment  Cost: may vary depending on your insurance coverage  Allergy shots for children age 38 and older are effective and often well tolerated. They might prevent the onset of new allergen sensitivities or the progression to asthma.  Allergy shots are not started on patients who are pregnant but can be continued on patients who become pregnant while receiving them. In some patients with other medical conditions or who take certain common medications, allergy shots may be of risk. It is important to mention other medications you talk to your allergist.   What are the two types of build-ups offered:   RUSH or Rapid Desensitization -- one day of injections lasting from 8:30-4:30pm, injections every 1 hour.  Approximately half of the build-up process is completed in that one day.  The following week, normal build-up is resumed, and this entails ~16 visits either weekly or twice weekly, until reaching your "maintenance dose" which is continued weekly until eventually getting spaced out to every month for a duration of 3 to 5 years. The regular build-up appointments are nurse visits where the injections  are administered, followed by required monitoring for 30 minutes.    Traditional build-up -- weekly visits for 6 -12 months until reaching "maintenance dose", then continue weekly until eventually spacing out to every 4 weeks as above. At these appointments, the injections are administered, followed by required monitoring for 30 minutes.     Either way is acceptable, and both are equally effective. With the rush protocol, the advantage is that less time is spent  here for injections overall AND you would also reach maintenance dosing faster (which is when the clinical benefit starts to become more apparent). Not everyone is a candidate for rapid desensitization.   IF we proceed with the RUSH protocol, there are premedications which must be taken the day before and the day after the rush only (this includes antihistamines, steroids, and Singulair).  After the rush day, no prednisone or Singulair is required, and we just recommend antihistamines taken on your injection day.  What Is An Estimate of the Costs?  If you are interested in starting allergy injections, please check with your insurance company about your coverage for both allergy vial sets and allergy injections.  Please do so prior to making the appointment to start injections.  The following are CPT codes to give to your insurance company. These are the amounts we BILL to the insurance company, but the amount YOU WILL PAY and WE RECEIVE IS SUBSTANTIALLY LESS and depends on the contracts we have with different insurance companies.   Amount Billed to Insurance Two allergy vial set  CPT 95165   $ 2400     Two injections   CPT 95117   $ 40 RUSH (Rapid Desensitization) CPT 95180 x 8 hours  $500/hour  Regarding the allergy injections, your co-pay may or may not apply with each injection, so please confirm this with your insurance company. When you start allergy injections, 1 or 2 sets of vials are made based on your allergies.  Not all patients can be on one set of vials. A set of vials lasts 6 months to a year depending on how quickly you can proceed with your build-up of your allergy injections. Vials are personalized for each patient depending on their specific allergens.  How often are allergy injection given during the build-up period?   Injections are given at least weekly during the build-up period until your maintenance dose is achieved. Per the doctor's discretion, you may have the option of  getting allergy injections two times per week during the build-up period. However, there must be at least 48 hours between injections. The build-up period is usually completed within 6-12 months depending on your ability to schedule injections and for adjustments for reactions. When maintenance dose is reached, your injection schedule is gradually changed to every two weeks and later to every three weeks. Injections will then continue every 4 weeks. Usually, injections are continued for a total of 3-5 years.   When Will I Feel Better?  Some may experience decreased allergy symptoms during the build-up phase. For others, it may take as long as 12 months on the maintenance dose. If there is no improvement after a year of maintenance, your allergist will discuss other treatment options with you.  If you aren't responding to allergy shots, it may be because there is not enough dose of the allergen in your vaccine or there are missing allergens that were not identified during your allergy testing. Other reasons could be that there are high levels of the allergen in  your environment or major exposure to non-allergic triggers like tobacco smoke.  What Is the Length of Treatment?  Once the maintenance dose is reached, allergy shots are generally continued for three to five years. The decision to stop should be discussed with your allergist at that time. Some people may experience a permanent reduction of allergy symptoms. Others may relapse and a longer course of allergy shots can be considered.  What Are the Possible Reactions?  The two types of adverse reactions that can occur with allergy shots are local and systemic. Common local reactions include very mild redness and swelling at the injection site, which can happen immediately or several hours after. Report a delayed reaction from your last injection. These include arm swelling or runny nose, watery eyes or cough that occurs within 12-24 hours after  injection. A systemic reaction, which is less common, affects the entire body or a particular body system. They are usually mild and typically respond quickly to medications. Signs include increased allergy symptoms such as sneezing, a stuffy nose or hives.   Rarely, a serious systemic reaction called anaphylaxis can develop. Symptoms include swelling in the throat, wheezing, a feeling of tightness in the chest, nausea or dizziness. Most serious systemic reactions develop within 30 minutes of allergy shots. This is why it is strongly recommended you wait in your doctor's office for 30 minutes after your injections. Your allergist is trained to watch for reactions, and his or her staff is trained and equipped with the proper medications to identify and treat them.   Report to the nurse immediately if you experience any of the following symptoms: swelling, itching or redness of the skin, hives, watery eyes/nose, breathing difficulty, excessive sneezing, coughing, stomach pain, diarrhea, or light headedness. These symptoms may occur within 15-20 minutes after injection and may require medication.   Who Should Administer Allergy Shots?  The preferred location for receiving shots is your prescribing allergist's office. Injections can sometimes be given at another facility where the physician and staff are trained to recognize and treat reactions, and have received instructions by your prescribing allergist.  What if I am late for an injection?   Injection dose will be adjusted depending upon how many days or weeks you are late for your injection.   What if I am sick?   Please report any illness to the nurse before receiving injections. She may adjust your dose or postpone injections depending on your symptoms. If you have fever, flu, sinus infection or chest congestion it is best to postpone allergy injections until you are better. Never get an allergy injection if your asthma is causing you problems. If  your symptoms persist, seek out medical care to get your health problem under control.  What If I am or Become Pregnant:  Women that become pregnant should schedule an appointment with [facility] before receiving any further allergy injections.

## 2023-07-23 NOTE — Progress Notes (Unsigned)
FOLLOW UP  Date of Service/Encounter:  07/23/23   Assessment:   Mild intermittent asthma, uncomplicated   Concern for food allergies - with testing positive to shrimp as well as orange   Seasonal and perennial allergic rhinitis (grasses, ragweed, weeds, trees, indoor molds, outdoor molds, dust mites, and cockroach)   History of exposure to indoor mold at a previous living situation  Working as a Biomedical engineer:   1. Mild intermittent asthma, uncomplicated - Lung testing not done today. - We are going to add on a daily controller medication to stay AHEAD of symptoms during the winter and spring seasons.  - Spacer sample and teaching provided.  - Daily controller medication(s): Symbicort 160/4.5 mcg two puffs twice daily during the winter and the spring. - Prior to physical activity: albuterol 2 puffs 10-15 minutes before physical activity. - Rescue medications: albuterol 4 puffs every 4-6 hours as needed - Asthma control goals:  * Full participation in all desired activities (may need albuterol before activity) * Albuterol use two time or less a week on average (not counting use with activity) * Cough interfering with sleep two time or less a month * Oral steroids no more than once a year * No hospitalizations  2. Concern for food allergies (shellfish, orange)  - Continue to avoid orange. - TRY to avoid shrimp (these levels were high on testing). - Emergency Action Plan updated.  - EpiPen training provided.  - We are trying to get an EpiPen of some sort covered by insurance.  - Information on Xolair provided to help prevent cross-contamination reactions.  - We are getting an IgE level to dose this if you decide to pursue this.   3.Seasonal and perennial allergic rhinitis - Testing at the last visit showed: grasses, ragweed, weeds, trees, indoor molds, outdoor molds, dust mites, and cockroach - Continue taking: Xyzal (levocetirizine) 5mg  tablet once  daily, Singulair (montelukast) 10mg  daily, Flonase (fluticasone) one spray per nostril daily (AIM FOR EAR ON EACH SIDE), and Astelin (azelastine) 2 sprays per nostril 1-2 times daily as needed - Singulair can cause irritability and bad dreams, so beware of this.  - You can use an extra dose of the antihistamine, if needed, for breakthrough symptoms.  - Consider nasal saline rinses 1-2 times daily to remove allergens from the nasal cavities as well as help with mucous clearance (this is especially helpful to do before the nasal sprays are given) - Consider allergy shots as a means of long-term control. - Allergy shots "re-train" and "reset" the immune system to ignore environmental allergens and decrease the resulting immune response to those allergens (sneezing, itchy watery eyes, runny nose, nasal congestion, etc).    - Allergy shots improve symptoms in 75-85% of patients.  - Consent signed today (two vials).  - This would help with your allergies (including postnasal drip) and your breathing.   4. Return in about 6 months (around 01/20/2024). You can have the follow up appointment with Dr. Dellis Anes or a Nurse Practicioner (our Nurse Practitioners are excellent and always have Physician oversight!).   Subjective:   Anna Dillon is a 28 y.o. female presenting today for follow up of  Chief Complaint  Patient presents with   Follow-up    Using  rescue inhaler more now that it is cold outside. 2 times a week.    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  Discussed the use of AI scribe software for clinical note transcription with the patient and/or guardian, who gave verbal consent to proceed.  Anna Dillon is a 28 y.o. female presenting for a follow up visit.  She was last seen in September 2024. At that time, her lung testing looked great.  We continue with albuterol as needed.  We did not feel like a controller was necessary at that time.  She underwent allergy testing that was positive to shrimp as well as shellfish mix and orange.  We obtained some blood work to confirm this.  She had environmental allergy testing that was positive to multiple indoor and outdoor allergens.  We started her on Xyzal, Singulair, Flonase, and Astelin.  Her confirmatory labs showed an allergy to shellfish with the highest to shrimp.  Since last visit, she has been better.   Asthma/Respiratory Symptom History: Anna Dillon presents with seasonal exacerbations of her respiratory symptoms, particularly during the winter and spring months. She reports being well-controlled during the summer and fall. She is currently on albuterol, but expresses difficulty maintaining control of her symptoms when she falls ill. She is open to trying a daily inhaler to stay ahead of symptoms during her problematic seasons.  Allergic Rhinitis Symptom History: Anna Dillon also reports a history of environmental allergies, particularly to dust, which she believes is causing postnasal drip. She avoids going outside to minimize exposure to allergens. She has discussed allergy shots in the past, but has not pursued this treatment due to lack of an Epipen.  Evidently, she was never able to pick up the 1 that we sent her due to insurance coverage.  She does have Medicaid, so there should be some form that is available to her.  She is open to doing allergy shots.  She recently started a new job, so her time availability is a little limited.  Food Allergy Symptom History: Anna Dillon also reports a history of food allergies, specifically to shellfish and oranges. Despite knowing her allergy to shrimp, she admits to occasionally consuming it, which results in allergic reactions. She also avoids oranges due to throat irritation.  She expresses interest in potential treatments that could decrease the risk of reactions from food exposures.  She will sometimes eat shrimp with Benadryl on board because she likes it so much.  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 She reports a high level of satisfaction with her current job and lifestyle.   Otherwise, there have been no changes to her past medical history, surgical history, family history, or social history.    Review of systems otherwise negative other than that mentioned in the HPI.    Objective:   Blood pressure 124/60, pulse 86, temperature 98.3 F (36.8 C), temperature source Temporal, resp. rate 16, height 5' 7.91" (1.725 m), weight 181 lb 9.6 oz (82.4 kg), SpO2 95%. Body mass index is 27.68 kg/m.    Physical Exam Vitals reviewed.  Constitutional:      Appearance: She is well-developed.     Comments: Friendly.  Cooperative with the exam.  HENT:     Head: Normocephalic and atraumatic.     Right Ear: Tympanic membrane, ear canal and external ear normal. No drainage, swelling or tenderness. Tympanic membrane is not injected, scarred, erythematous, retracted or bulging.  Left Ear: Tympanic membrane, ear canal and external ear normal. No drainage, swelling or tenderness. Tympanic membrane is not injected, scarred, erythematous, retracted or bulging.     Nose: No nasal deformity, septal deviation, mucosal edema or rhinorrhea.     Right Turbinates: Enlarged, swollen and pale.     Left Turbinates: Enlarged, swollen and pale.     Right Sinus: No maxillary sinus tenderness or frontal sinus tenderness.     Left Sinus: No maxillary sinus tenderness or frontal sinus tenderness.     Mouth/Throat:     Lips: Pink.     Mouth: Mucous membranes are moist. Mucous membranes are not pale and not dry.     Pharynx: Uvula midline.     Comments: Marked cobblestoning. Eyes:     General:         Right eye: No discharge.        Left eye: No discharge.     Conjunctiva/sclera: Conjunctivae normal.     Right eye: Right conjunctiva is not injected. No chemosis.    Left eye: Left conjunctiva is not injected. No chemosis.    Pupils: Pupils are equal, round, and reactive to light.  Cardiovascular:     Rate and Rhythm: Normal rate and regular rhythm.     Heart sounds: Normal heart sounds.  Pulmonary:     Effort: Pulmonary effort is normal. No tachypnea, accessory muscle usage or respiratory distress.     Breath sounds: Normal breath sounds. No wheezing, rhonchi or rales.  Chest:     Chest wall: No tenderness.  Abdominal:     Tenderness: There is no abdominal tenderness. There is no guarding or rebound.  Lymphadenopathy:     Head:     Right side of head: No submandibular, tonsillar or occipital adenopathy.     Left side of head: No submandibular, tonsillar or occipital adenopathy.     Cervical: No cervical adenopathy.  Skin:    Coloration: Skin is not pale.     Findings: No abrasion, erythema, petechiae or rash. Rash is not papular, urticarial or vesicular.  Neurological:     Mental Status: She is alert.  Psychiatric:        Behavior: Behavior is cooperative.      Diagnostic studies: none        Malachi Bonds, MD  [facility]

## 2023-07-25 ENCOUNTER — Encounter: Payer: Self-pay | Admitting: Allergy & Immunology

## 2023-07-26 ENCOUNTER — Telehealth: Payer: Self-pay

## 2023-07-26 LAB — IGE: IgE (Immunoglobulin E), Serum: 584 [IU]/mL — ABNORMAL HIGH (ref 6–495)

## 2023-07-26 NOTE — Telephone Encounter (Signed)
-----   Message from Anna Dillon sent at 07/25/2023  6:01 AM EST ----- Can we make sure that she was able to pick up her EpiPen? Also please ask whether she was interested in allergy shots. I did send Tammy a message about her Xolair for food allergies, so she should be reaching out sometime soon.

## 2023-07-26 NOTE — Telephone Encounter (Signed)
Called patient - DOB verified - advised of provider notation below.  Patient stated she has not p/u Epi Pen - will call to make sure it's ready for p/u.  Patient stated she is interested in doing allergy immunotherapy.  Patient stated she noticed her lab results were in - advised one provider has had the opportunity to review the results/make recommendations - he will send her a myChart message.  Patient advised updated message will be forwarded to provider.  Patient verbalized understanding to all, no further questions.

## 2023-07-30 ENCOUNTER — Encounter: Payer: Self-pay | Admitting: Allergy & Immunology

## 2023-07-31 ENCOUNTER — Telehealth: Payer: Self-pay | Admitting: *Deleted

## 2023-07-31 ENCOUNTER — Telehealth: Payer: Self-pay | Admitting: Allergy & Immunology

## 2023-07-31 MED ORDER — XOLAIR 300 MG/2ML ~~LOC~~ SOSY
375.0000 mg | PREFILLED_SYRINGE | SUBCUTANEOUS | 11 refills | Status: DC
  Filled 2023-08-15: qty 4, 14d supply, fill #0

## 2023-07-31 MED ORDER — OMALIZUMAB 75 MG/0.5ML ~~LOC~~ SOSY
375.0000 mg | PREFILLED_SYRINGE | SUBCUTANEOUS | 11 refills | Status: DC
  Filled 2023-08-15: qty 1, 14d supply, fill #0

## 2023-07-31 NOTE — Telephone Encounter (Signed)
Called and left a message for patient to call our office back to inquire if she was checking with insurance about her starting allergy injections or if she didn't want to begin.

## 2023-07-31 NOTE — Telephone Encounter (Signed)
-----   Message from Alfonse Spruce sent at 07/25/2023  6:01 AM EST ----- Interested in Xolair for food allergy. I sent an IgE level on Tuesday.

## 2023-07-31 NOTE — Telephone Encounter (Signed)
I did not see that she had made a start injection appointment. Can we confirm that she wanted to do that?  Malachi Bonds, MD [facility]

## 2023-07-31 NOTE — Telephone Encounter (Signed)
Called patient and advised approval and submit to [facility] for Xolair. 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 I will reach out once delivery set to make appt to start therapy

## 2023-07-31 NOTE — Telephone Encounter (Signed)
Patient was left a message in reference to starting Allergy Injections Patient stating she has not spoke with her insurance yet will return call when she gets a chance to call also patient is asking for a call from the nurse for lab results

## 2023-08-01 ENCOUNTER — Other Ambulatory Visit: Payer: Self-pay

## 2023-08-01 ENCOUNTER — Other Ambulatory Visit (HOSPITAL_COMMUNITY): Payer: Self-pay

## 2023-08-01 NOTE — Telephone Encounter (Signed)
I called the patient and left a message for the call the office back about starting immunotherapy injections.

## 2023-08-01 NOTE — Progress Notes (Signed)
Patient to use 300mg  along with 75mg  q 14 days

## 2023-08-02 NOTE — Telephone Encounter (Signed)
Thanks, Tam Tam!   Aaronjames Kelsay, MD [facility]  

## 2023-08-05 ENCOUNTER — Other Ambulatory Visit (HOSPITAL_COMMUNITY): Payer: Self-pay

## 2023-08-15 ENCOUNTER — Other Ambulatory Visit: Payer: Self-pay

## 2023-08-15 ENCOUNTER — Other Ambulatory Visit (HOSPITAL_COMMUNITY): Payer: Self-pay

## 2023-09-06 ENCOUNTER — Telehealth: Payer: Self-pay

## 2023-09-06 NOTE — Telephone Encounter (Signed)
 Never filled Was unsure of wanting to start meds Tammy aware  *sent to [facility]

## 2023-10-03 ENCOUNTER — Other Ambulatory Visit: Payer: Self-pay

## 2023-10-18 ENCOUNTER — Encounter (HOSPITAL_BASED_OUTPATIENT_CLINIC_OR_DEPARTMENT_OTHER): Payer: Self-pay

## 2023-10-18 DIAGNOSIS — Z5321 Procedure and treatment not carried out due to patient leaving prior to being seen by health care provider: Secondary | ICD-10-CM | POA: Insufficient documentation

## 2023-10-18 DIAGNOSIS — N939 Abnormal uterine and vaginal bleeding, unspecified: Secondary | ICD-10-CM | POA: Diagnosis present

## 2023-10-18 LAB — CBC
HCT: 39.5 % (ref 36.0–46.0)
Hemoglobin: 13.5 g/dL (ref 12.0–15.0)
MCH: 29.5 pg (ref 26.0–34.0)
MCHC: 34.2 g/dL (ref 30.0–36.0)
MCV: 86.4 fL (ref 80.0–100.0)
Platelets: 246 K/uL (ref 150–400)
RBC: 4.57 MIL/uL (ref 3.87–5.11)
RDW: 12.6 % (ref 11.5–15.5)
WBC: 9.2 K/uL (ref 4.0–10.5)
nRBC: 0 % (ref 0.0–0.2)

## 2023-10-18 NOTE — ED Triage Notes (Signed)
 Pt states that she has been having vaginal bleeding and cramping for the past few days, pt states she has not had a period in years due to being on depo

## 2023-10-19 ENCOUNTER — Emergency Department (HOSPITAL_BASED_OUTPATIENT_CLINIC_OR_DEPARTMENT_OTHER)
Admission: EM | Admit: 2023-10-19 | Discharge: 2023-10-19 | Discharge status: Against Medical Advice | Attending: Emergency Medicine | Admitting: Emergency Medicine

## 2023-10-19 ENCOUNTER — Ambulatory Visit (HOSPITAL_COMMUNITY)
Admission: EM | Admit: 2023-10-19 | Discharge: 2023-10-19 | Disposition: A | Discharge status: Home / Self Care | Attending: Internal Medicine | Admitting: Internal Medicine

## 2023-10-19 ENCOUNTER — Encounter (HOSPITAL_COMMUNITY): Payer: Self-pay

## 2023-10-19 DIAGNOSIS — Z113 Encounter for screening for infections with a predominantly sexual mode of transmission: Secondary | ICD-10-CM | POA: Insufficient documentation

## 2023-10-19 DIAGNOSIS — N939 Abnormal uterine and vaginal bleeding, unspecified: Secondary | ICD-10-CM | POA: Insufficient documentation

## 2023-10-19 LAB — HCG, QUANTITATIVE, PREGNANCY: hCG, Beta Chain, Quant, S: <1 m[IU]/mL (ref <5)

## 2023-10-19 NOTE — Discharge Instructions (Addendum)
 Screening swab done today and results will be available in 24-48 hours. We will contact you if we need to arrange additional treatment based on your testing. Negative results will be on your MyChart account. Abstain from sex until you receive your final results.     Vaginal bleeding could be some aspect of breakthrough bleeding while on Depo.  Recommend scheduling a follow-up appointment with your gynecologist especially if the bleeding persist.  We did review your CBC from the hospital last night which showed a normal hemoglobin.  Return to urgent care or the emergency room if bleeding worsens or is still persistent.

## 2023-10-19 NOTE — ED Triage Notes (Signed)
 Patient states that she is having vaginal bleeding. Last depo injection was 09/18/2023. Patient is not soaking 1-2 pads per hour.

## 2023-10-19 NOTE — ED Provider Notes (Signed)
 [facility]    CSN: [id number] Arrival date & time: 10/19/23  1412      History   Chief Complaint Chief Complaint  Patient presents with   Vaginal Bleeding    HPI Anna Dillon is a 28 y.o. female.   28 year old female who presents urgent care with complaints of vaginal bleeding.  This started on 4/16.  She has not had a period in quite some time as she is on Depo so was surprised to see some bleeding.  She has had some associated suprapubic pain as well as a slight odor.  She did have intercourse with her boyfriend prior to having the bleeding.  She denies any itching, vaginal pain, other vaginal discharge, dysuria, hematuria, fevers, chills.  She is overdue for a Pap smear.  She did go last night to the emergency room but left before she was seen.  They did a pregnancy test which was negative and a CBC which was within normal limits.   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      Home Medications    Prior to Admission medications   Medication Sig Start Date End Date Taking? Authorizing Provider  albuterol (VENTOLIN HFA) 108 (90 Base) MCG/ACT inhaler Inhale 2 puffs into the lungs every 4 (four) hours as needed for shortness of breath or wheezing.    [provider]  azelastine  (ASTELIN ) 0.1 % nasal spray Place 2 sprays into both nostrils 2 (two) times daily as needed for rhinitis. Use in each nostril as directed 03/05/23   Rochester Chuck, MD   budesonide -formoterol  (SYMBICORT ) 160-4.5 MCG/ACT inhaler Inhale 2 puffs into the lungs in the morning and at bedtime. 07/23/23   Rochester Chuck, MD  EPINEPHrine  0.3 mg/0.3 mL IJ SOAJ injection Inject 0.3 mg into the muscle as needed for anaphylaxis. 07/23/23   Rochester Chuck, MD  fluticasone  (FLONASE ) 50 MCG/ACT nasal spray Place 2 sprays into both nostrils 2 (two) times daily as needed for allergies or rhinitis. 03/05/23   Rochester Chuck, MD  levocetirizine (XYZAL ) 5 MG tablet Take 1 tablet (5 mg total) by mouth every evening. 03/05/23   Rochester Chuck, MD  montelukast  (SINGULAIR ) 10 MG tablet Take 10 mg by mouth at bedtime.    [provider]  omalizumab  (XOLAIR ) 300 MG/2  ML prefilled syringe Inject 375 mg into the skin every 14 (fourteen) days. 07/31/23   Rochester Chuck, MD  omalizumab  (XOLAIR ) 75 MG/0.5ML prefilled syringe Inject 375 mg into the skin every 14 (fourteen) days. 07/31/23   Rochester Chuck, MD  famotidine  (PEPCID ) 20 MG tablet Take 1 tablet (20 mg total) by mouth 2 (two) times daily. 10/29/17 01/09/19  Palumbo, April, MD    Family History Family History  Problem Relation Age of Onset   Healthy Mother    Healthy Father     Social History Social History   Tobacco Use  Smoking status: Never   Smokeless tobacco: Never  Vaping Use   Vaping status: Never Used  Substance Use Topics   Alcohol use: Not Currently   Drug use: Yes    Frequency: 1.0 times per week    Types: Marijuana     Allergies   Augmentin [amoxicillin-pot clavulanate] and Shellfish allergy    Review of Systems Review of Systems  Constitutional:  Negative for chills and fever.  HENT:  Negative for ear pain and sore throat.   Eyes:  Negative for pain and visual disturbance.  Respiratory:  Negative for cough and shortness of breath.   Cardiovascular:  Negative for chest pain and palpitations.  Gastrointestinal:  Positive for abdominal pain (Suprapubic).  Negative for vomiting.  Genitourinary:  Positive for vaginal bleeding. Negative for difficulty urinating, dysuria, frequency, hematuria and vaginal discharge.  Musculoskeletal:  Negative for arthralgias and back pain.  Skin:  Negative for color change and rash.  Neurological:  Negative for seizures and syncope.  All other systems reviewed and are negative.    Physical Exam Triage Vital Signs ED Triage Vitals  Encounter Vitals Group     BP 10/19/23 1438 128/86     Systolic BP Percentile --      Diastolic BP Percentile --      Pulse Rate 10/19/23 1438 93     Resp 10/19/23 1438 16     Temp 10/19/23 1438 98.4 F (36.9 C)     Temp Source 10/19/23 1438 Oral     SpO2 10/19/23 1438 98 %     Weight --      Height --      Head Circumference --      Peak Flow --      Pain Score 10/19/23 1439 0     Pain Loc --      Pain Education --      Exclude from Growth Chart --    No data found.  Updated Vital Signs BP 128/86 (BP Location: Left Arm)   Pulse 93   Temp 98.4 F (36.9 C) (Oral)   Resp 16   SpO2 98%   Visual Acuity Right Eye Distance:   Left Eye Distance:   Bilateral Distance:    Right Eye Near:   Left Eye Near:    Bilateral Near:     Physical Exam Vitals and nursing note reviewed.  Constitutional:      General: She is not in acute distress.    Appearance: She is well-developed.  HENT:     Head: Normocephalic and atraumatic.  Eyes:     Conjunctiva/sclera: Conjunctivae normal.  Cardiovascular:     Rate and Rhythm: Normal rate and regular rhythm.     Heart sounds: No murmur heard. Pulmonary:     Effort: Pulmonary effort is normal. No respiratory distress.     Breath sounds: Normal breath sounds.  Abdominal:     Palpations: Abdomen is soft.     Tenderness: There is abdominal tenderness in the suprapubic area.     Comments: Mild suprapubic pain  Musculoskeletal:        General: No swelling.     Cervical back: Neck supple.  Skin:    General: Skin is warm and  dry.     Capillary Refill: Capillary refill takes less than 2 seconds.  Neurological:     Mental Status: She is alert.  Psychiatric:        Mood and Affect: Mood normal.      UC Treatments /  Results  Labs (all labs ordered are listed, but only abnormal results are displayed) Labs Reviewed - No data to display  EKG   Radiology No results found.  Procedures Procedures (including critical care time)  Medications Ordered in UC Medications - No data to display  Initial Impression / Assessment and Plan / UC Course  I have reviewed the triage vital signs and the nursing notes.  Pertinent labs & imaging results that were available during my care of the patient were reviewed by me and considered in my medical decision making (see chart for details).     Vaginal bleeding  Screening examination for STI   Screening swab done today and results will be available in 24-48 hours. We will contact you if we need to arrange additional treatment based on your testing. Negative results will be on your MyChart account. Abstain from sex until you receive your final results.     Vaginal bleeding could be some aspect of breakthrough bleeding while on Depo.  Recommend scheduling a follow-up appointment with your gynecologist especially if the bleeding persist.  We did review your CBC from the hospital last night which showed a normal hemoglobin.  Return to urgent care or the emergency room if bleeding worsens or is still persistent.   Final Clinical Impressions(s) / UC Diagnoses   Final diagnoses:  None   Discharge Instructions   None    ED Prescriptions   None    PDMP not reviewed this encounter.   Kreg Pesa, [place] 10/19/23 1519

## 2023-10-21 LAB — CERVICOVAGINAL ANCILLARY ONLY
Bacterial Vaginitis (gardnerella): POSITIVE — AB
Chlamydia: NEGATIVE
Comment: NEGATIVE
Comment: NEGATIVE
Comment: NEGATIVE
Comment: NORMAL
Neisseria Gonorrhea: NEGATIVE
Trichomonas: NEGATIVE

## 2023-10-23 ENCOUNTER — Telehealth (HOSPITAL_COMMUNITY): Payer: Self-pay

## 2023-10-23 MED ORDER — METRONIDAZOLE 500 MG PO TABS: 500.0000 mg | ORAL_TABLET | Freq: Two times a day (BID) | ORAL | 0 refills | Status: AC

## 2023-10-23 NOTE — Telephone Encounter (Signed)
 Per protocol, pt requires tx with metronidazole. Rx sent to pharmacy on file.

## 2023-11-07 ENCOUNTER — Other Ambulatory Visit: Payer: Self-pay

## 2023-11-07 NOTE — Progress Notes (Signed)
 Patient initially unsure about initiating therapy. Reported that she would call us  back if she decided to initiate. Patient has not started treatment in greater than 3 months, disenrolled.

## 2024-01-08 ENCOUNTER — Encounter: Payer: Self-pay | Admitting: Obstetrics and Gynecology

## 2024-01-08 ENCOUNTER — Ambulatory Visit: Admitting: Obstetrics and Gynecology

## 2024-01-08 VITALS — BP 102/60 | HR 94 | Temp 98.3°F | Ht 68.75 in | Wt 188.0 lb

## 2024-01-08 DIAGNOSIS — R87610 Atypical squamous cells of undetermined significance on cytologic smear of cervix (ASC-US): Secondary | ICD-10-CM | POA: Insufficient documentation

## 2024-01-08 DIAGNOSIS — N944 Primary dysmenorrhea: Secondary | ICD-10-CM | POA: Insufficient documentation

## 2024-01-08 DIAGNOSIS — N920 Excessive and frequent menstruation with regular cycle: Secondary | ICD-10-CM | POA: Diagnosis not present

## 2024-01-08 NOTE — Assessment & Plan Note (Signed)
 Well controlled with Depo, continue

## 2024-01-08 NOTE — Progress Notes (Signed)
 28 y.o. G0P0000 female on Depo, hx of fibrocystic breasts (US  before 28yo), menorrhagia, primary dysmenorrhea here for referral for abnormal PAP. Single. 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  No LMP recorded. Patient has had an injection.   She reports hx of abnormal cervical pap smears. She also notes cramping off an on when he is due for her Depo, which is getting worse. Takes Tylenol  PRN. Bleeding is well controlled.  11/04/23 ASCUS,HPV neg. Hx of abnl PAP (noted in referral) Reviewed records in patient's portal (patient to complete records request): 2023 NIL 2022 ASCUS, HPV neg 2020 NIL, HPV pos  Gardasil: complete Birth control: Depo Injection, taking since 2012, took 1 yr break and switched to nuvaring > migraines Last mammogram: never Sexually active: Yes   GYN HISTORY: No sig hx  OB History  Gravida Para Term Preterm AB Living  0 0 0 0 0 0  SAB IAB Ectopic Multiple Live Births  0 0 0 0 0   Past Medical History:  Diagnosis Date   Asthma    Eczema    Migraine    Past Surgical History:  Procedure Laterality Date   TONSILLECTOMY  10/2015   Current Outpatient Medications on File Prior to Visit  Medication Sig Dispense Refill   albuterol (VENTOLIN HFA) 108 (90 Base) MCG/ACT inhaler Inhale 2 puffs into the lungs every 4 (four) hours as needed for shortness of breath or wheezing.     azelastine  (ASTELIN ) 0.1 % nasal spray Place 2 sprays into both nostrils 2 (two) times daily as needed for rhinitis. Use in each nostril as directed 90 mL 1   budesonide -formoterol  (SYMBICORT ) 160-4.5 MCG/ACT inhaler Inhale 2 puffs into the lungs in the morning and at bedtime. 1 each 5   EPINEPHrine  0.3 mg/0.3 mL IJ SOAJ injection Inject 0.3 mg into the muscle as needed for anaphylaxis. 1 each 1   fluticasone  (FLONASE ) 50 MCG/ACT nasal spray Place 2 sprays into both nostrils 2 (two) times daily as needed for allergies or rhinitis. 48 g 1   levocetirizine (XYZAL ) 5 MG tablet Take 1 tablet  (5 mg total) by mouth every evening. 90 tablet 1   medroxyPROGESTERone (DEPO-PROVERA) 150 MG/ML injection Inject 150 mg into the muscle every 3 (three) months.     omalizumab  (XOLAIR ) 300 MG/2  ML prefilled syringe Inject 375 mg into the skin every 14 (fourteen) days. 4 mL 11   omalizumab  (XOLAIR ) 75 MG/0.5ML prefilled syringe Inject 375 mg into the skin every 14 (fourteen) days. 1 mL 11   montelukast  (SINGULAIR ) 10 MG tablet Take 10 mg by mouth at bedtime. (Patient not taking: Reported on 01/08/2024)     [DISCONTINUED] famotidine  (PEPCID ) 20 MG tablet Take 1 tablet (20 mg total) by mouth 2 (two) times daily. 30 tablet 0   No current facility-administered medications on file prior to visit.   Allergies  Allergen Reactions   Augmentin [Amoxicillin-Pot Clavulanate]     Excessive vomiting   Shellfish Allergy  Nausea And Vomiting      PE Today's Vitals   01/08/24 1409  BP: 102/60  Pulse: 94  Temp: 98.3 F (36.8 C)  TempSrc: Oral  SpO2: 98%  Weight: 188 lb (85.3 kg)  Height: 5' 8.75 (1.746 m)   Body mass index is 27.97 kg/m.  Physical Exam Vitals reviewed.  Constitutional:      General: She is not in acute distress.    Appearance: Normal appearance.  HENT:     Head: Normocephalic and atraumatic.  Nose: Nose normal.  Eyes:     Extraocular Movements: Extraocular movements intact.     Conjunctiva/sclera: Conjunctivae normal.  Pulmonary:     Effort: Pulmonary effort is normal.  Musculoskeletal:        General: Normal range of motion.     Cervical back: Normal range of motion.  Neurological:     General: No focal deficit present.     Mental Status: She is alert.  Psychiatric:        Mood and Affect: Mood normal.        Behavior: Behavior normal.      Assessment and Plan:        ASCUS of cervix with negative high risk HPV Assessment & Plan: Reviewed PAP results and history. Reviewed HPV infection in association with cervical cancer. Reviewed ASCCP  guidelines. Recommendations for cotesting in 1 year were made. Completed Gardasil. I also recommend voidance of smoking and use of condoms with new sexual partners to prevent progression of disease. All questions answered.     Primary dysmenorrhea Assessment & Plan: Well controlled with Depo, continue   Menorrhagia with regular cycle  As above  Vera LULLA Pa, MD

## 2024-01-08 NOTE — Assessment & Plan Note (Addendum)
 Reviewed PAP results and history. Reviewed HPV infection in association with cervical cancer. Reviewed ASCCP guidelines. Recommendations for cotesting in 1 year were made. Completed Gardasil. I also recommend voidance of smoking and use of condoms with new sexual partners to prevent progression of disease. All questions answered.

## 2024-01-23 ENCOUNTER — Ambulatory Visit: Payer: Medicaid Other | Admitting: Allergy & Immunology

## 2024-02-03 ENCOUNTER — Encounter: Payer: Self-pay | Admitting: Obstetrics and Gynecology

## 2024-02-04 ENCOUNTER — Encounter: Payer: Self-pay | Admitting: Allergy & Immunology

## 2024-02-04 ENCOUNTER — Other Ambulatory Visit: Payer: Self-pay

## 2024-02-04 ENCOUNTER — Ambulatory Visit: Admitting: Allergy & Immunology

## 2024-02-04 VITALS — BP 124/74 | HR 91 | Temp 98.8°F | Resp 19

## 2024-02-04 DIAGNOSIS — J4520 Mild intermittent asthma, uncomplicated: Secondary | ICD-10-CM

## 2024-02-04 DIAGNOSIS — T7802XD Anaphylactic reaction due to shellfish (crustaceans), subsequent encounter: Secondary | ICD-10-CM | POA: Diagnosis not present

## 2024-02-04 DIAGNOSIS — J3089 Other allergic rhinitis: Secondary | ICD-10-CM | POA: Diagnosis not present

## 2024-02-04 DIAGNOSIS — R208 Other disturbances of skin sensation: Secondary | ICD-10-CM

## 2024-02-04 DIAGNOSIS — J302 Other seasonal allergic rhinitis: Secondary | ICD-10-CM

## 2024-02-04 NOTE — Patient Instructions (Addendum)
 1. Mild intermittent asthma, uncomplicated - Lung testing looks excellent today. - We are not going to make any changes at this time.  - Daily controller medication(s): Symbicort  160/4.5 mcg two puffs twice daily during the winter and the spring. - Prior to physical activity: albuterol  2 puffs 10-15 minutes before physical activity. - Rescue medications: albuterol  4 puffs every 4-6 hours as needed - Asthma control goals:  * Full participation in all desired activities (may need albuterol  before activity) * Albuterol  use two time or less a week on average (not counting use with activity) * Cough interfering with sleep two time or less a month * Oral steroids no more than once a year * No hospitalizations  2. Concern for food allergies (shellfish, orange)  - We will grab a lemon allergy  level to see if this is positive in the blood.  - Testing on the skin was negative, thankfully. - We will call you in 1-2 weeks with the results of the testing,.  - TRY to avoid shrimp (these levels were high on testing). - Emergency Action Plan updated.  - EpiPen  training provided.  - PLEASE AVOID SHRIMP! :-p  3.Seasonal and perennial allergic rhinitis - Testing at the last visit showed: grasses, ragweed, weeds, trees, indoor molds, outdoor molds, dust mites, and cockroach - Continue taking: Xyzal  (levocetirizine) 5mg  tablet once daily, Singulair  (montelukast ) 10mg  daily, Flonase  (fluticasone ) one spray per nostril daily (AIM FOR EAR ON EACH SIDE), and Astelin  (azelastine ) 2 sprays per nostril 1-2 times daily as needed - Singulair  can cause irritability and bad dreams, so beware of this.  - You can use an extra dose of the antihistamine, if needed, for breakthrough symptoms.  - Consider nasal saline rinses 1-2 times daily to remove allergens from the nasal cavities as well as help with mucous clearance (this is especially helpful to do before the nasal sprays are given) - Consider allergy  shots as a means  of long-term control. - Allergy  shots re-train and reset the immune system to ignore environmental allergens and decrease the resulting immune response to those allergens (sneezing, itchy watery eyes, runny nose, nasal congestion, etc).    - Allergy  shots improve symptoms in 75-85% of patients.  - Consent signed today (two vials).  - This would help with your allergies (including postnasal drip) and your breathing.   4. Return in about 6 months (around 08/06/2024). You can have the follow up appointment with Dr. Iva or a Nurse Practicioner (our Nurse Practitioners are excellent and always have Physician oversight!).    Please inform us  of any Emergency Department visits, hospitalizations, or changes in symptoms. Call us  before going to the ED for breathing or allergy  symptoms since we might be able to fit you in for a sick visit. Feel free to contact us  anytime with any questions, problems, or concerns.  It was a pleasure to see you again today!  Websites that have reliable patient information: 1. American Academy of Asthma, Allergy , and Immunology: www.aaaai.org 2. Food Allergy  Research and Education (FARE): foodallergy.org 3. Mothers of Asthmatics: http://www.asthmacommunitynetwork.org 4. American College of Allergy , Asthma, and Immunology: www.acaai.org      "Like" us  on Facebook and Instagram for our latest updates!      A healthy democracy works best when [employer] participate! Make sure you are registered to vote! If you have moved or changed any of your contact information, you will need to get this updated before voting! Scan the QR codes below to learn more!  Allergy  Shots  Allergies are the result of a chain reaction that starts in the immune system. Your immune system controls how your body defends itself. For instance, if you have an allergy  to pollen, your immune system identifies pollen as an invader or allergen. Your immune system overreacts by  producing antibodies called Immunoglobulin E (IgE). These antibodies travel to cells that release chemicals, causing an allergic reaction.  The concept behind allergy  immunotherapy, whether it is received in the form of shots or tablets, is that the immune system can be desensitized to specific allergens that trigger allergy  symptoms. Although it requires time and patience, the payback can be long-term relief. Allergy  injections contain a dilute solution of those substances that you are allergic to based upon your skin testing and allergy  history.   How Do Allergy  Shots Work?  Allergy  shots work much like a vaccine. Your body responds to injected amounts of a particular allergen given in increasing doses, eventually developing a resistance and tolerance to it. Allergy  shots can lead to decreased, minimal or no allergy  symptoms.  There generally are two phases: build-up and maintenance. Build-up often ranges from three to six months and involves receiving injections with increasing amounts of the allergens. The shots are typically given once or twice a week, though more rapid build-up schedules are sometimes used.  The maintenance phase begins when the most effective dose is reached. This dose is different for each person, depending on how allergic you are and your response to the build-up injections. Once the maintenance dose is reached, there are longer periods between injections, typically two to four weeks.  Occasionally doctors give cortisone-type shots that can temporarily reduce allergy  symptoms. These types of shots are different and should not be confused with allergy  immunotherapy shots.  Who Can Be Treated with Allergy  Shots?  Allergy  shots may be a good treatment approach for people with allergic rhinitis (hay fever), allergic asthma, conjunctivitis (eye allergy ) or stinging insect allergy .   Before deciding to begin allergy  shots, you should consider:   The length of allergy  season  and the severity of your symptoms  Whether medications and/or changes to your environment can control your symptoms  Your desire to avoid long-term medication use  Time: allergy  immunotherapy requires a major time commitment  Cost: may vary depending on your insurance coverage  Allergy  shots for children age 37 and older are effective and often well tolerated. They might prevent the onset of new allergen sensitivities or the progression to asthma.  Allergy  shots are not started on patients who are pregnant but can be continued on patients who become pregnant while receiving them. In some patients with other medical conditions or who take certain common medications, allergy  shots may be of risk. It is important to mention other medications you talk to your allergist.   What are the two types of build-ups offered:   RUSH or Rapid Desensitization -- one day of injections lasting from 8:30-4:30pm, injections every 1 hour.  Approximately half of the build-up process is completed in that one day.  The following week, normal build-up is resumed, and this entails ~16 visits either weekly or twice weekly, until reaching your "maintenance dose" which is continued weekly until eventually getting spaced out to every month for a duration of 3 to 5 years. The regular build-up appointments are nurse visits where the injections are administered, followed by required monitoring for 30 minutes.    Traditional build-up -- weekly visits for 6 -12 months until reaching "  maintenance dose", then continue weekly until eventually spacing out to every 4 weeks as above. At these appointments, the injections are administered, followed by required monitoring for 30 minutes.     Either way is acceptable, and both are equally effective. With the rush protocol, the advantage is that less time is spent here for injections overall AND you would also reach maintenance dosing faster (which is when the clinical benefit starts to  become more apparent). Not everyone is a candidate for rapid desensitization.   IF we proceed with the RUSH protocol, there are premedications which must be taken the day before and the day after the rush only (this includes antihistamines, steroids, and Singulair ).  After the rush day, no prednisone  or Singulair  is required, and we just recommend antihistamines taken on your injection day.  What Is An Estimate of the Costs?  If you are interested in starting allergy  injections, please check with your insurance company about your coverage for both allergy  vial sets and allergy  injections.  Please do so prior to making the appointment to start injections.  The following are CPT codes to give to your insurance company. These are the amounts we BILL to the insurance company, but the amount YOU WILL PAY and WE RECEIVE IS SUBSTANTIALLY LESS and depends on the contracts we have with different insurance companies.   Amount Billed to Insurance Two allergy  vial set  CPT 95165   $ 2400     Two injections   CPT 95117   $ 40 RUSH (Rapid Desensitization) CPT 95180 x 8 hours  $500/hour  Regarding the allergy  injections, your co-pay may or may not apply with each injection, so please confirm this with your insurance company. When you start allergy  injections, 1 or 2 sets of vials are made based on your allergies.  Not all patients can be on one set of vials. A set of vials lasts 6 months to a year depending on how quickly you can proceed with your build-up of your allergy  injections. Vials are personalized for each patient depending on their specific allergens.  How often are allergy  injection given during the build-up period?   Injections are given at least weekly during the build-up period until your maintenance dose is achieved. Per the doctor's discretion, you may have the option of getting allergy  injections two times per week during the build-up period. However, there must be at least 48 hours between  injections. The build-up period is usually completed within 6-12 months depending on your ability to schedule injections and for adjustments for reactions. When maintenance dose is reached, your injection schedule is gradually changed to every two weeks and later to every three weeks. Injections will then continue every 4 weeks. Usually, injections are continued for a total of 3-5 years.   When Will I Feel Better?  Some may experience decreased allergy  symptoms during the build-up phase. For others, it may take as long as 12 months on the maintenance dose. If there is no improvement after a year of maintenance, your allergist will discuss other treatment options with you.  If you aren't responding to allergy  shots, it may be because there is not enough dose of the allergen in your vaccine or there are missing allergens that were not identified during your allergy  testing. Other reasons could be that there are high levels of the allergen in your environment or major exposure to non-allergic triggers like tobacco smoke.  What Is the Length of Treatment?  Once the maintenance dose  is reached, allergy  shots are generally continued for three to five years. The decision to stop should be discussed with your allergist at that time. Some people may experience a permanent reduction of allergy  symptoms. Others may relapse and a longer course of allergy  shots can be considered.  What Are the Possible Reactions?  The two types of adverse reactions that can occur with allergy  shots are local and systemic. Common local reactions include very mild redness and swelling at the injection site, which can happen immediately or several hours after. Report a delayed reaction from your last injection. These include arm swelling or runny nose, watery eyes or cough that occurs within 12-24 hours after injection. A systemic reaction, which is less common, affects the entire body or a particular body system. They are usually  mild and typically respond quickly to medications. Signs include increased allergy  symptoms such as sneezing, a stuffy nose or hives.   Rarely, a serious systemic reaction called anaphylaxis can develop. Symptoms include swelling in the throat, wheezing, a feeling of tightness in the chest, nausea or dizziness. Most serious systemic reactions develop within 30 minutes of allergy  shots. This is why it is strongly recommended you wait in your doctor's office for 30 minutes after your injections. Your allergist is trained to watch for reactions, and his or her staff is trained and equipped with the proper medications to identify and treat them.   Report to the nurse immediately if you experience any of the following symptoms: swelling, itching or redness of the skin, hives, watery eyes/nose, breathing difficulty, excessive sneezing, coughing, stomach pain, diarrhea, or light headedness. These symptoms may occur within 15-20 minutes after injection and may require medication.   Who Should Administer Allergy  Shots?  The preferred location for receiving shots is your prescribing allergist's office. Injections can sometimes be given at another facility where the physician and staff are trained to recognize and treat reactions, and have received instructions by your prescribing allergist.  What if I am late for an injection?   Injection dose will be adjusted depending upon how many days or weeks you are late for your injection.   What if I am sick?   Please report any illness to the nurse before receiving injections. She may adjust your dose or postpone injections depending on your symptoms. If you have fever, flu, sinus infection or chest congestion it is best to postpone allergy  injections until you are better. Never get an allergy  injection if your asthma is causing you problems. If your symptoms persist, seek out medical care to get your health problem under control.  What If I am or Become Pregnant:   Women that become pregnant should schedule an appointment with [facility] before receiving any further allergy  injections.

## 2024-02-04 NOTE — Progress Notes (Unsigned)
 FOLLOW UP  Date of Service/Encounter:  02/04/24   Assessment:   Mild intermittent asthma, uncomplicated   Concern for food allergies - with testing positive to shrimp as well as orange   Seasonal and perennial allergic rhinitis (grasses, ragweed, weeds, trees, indoor molds, outdoor molds, dust mites, and cockroach)   History of exposure to indoor mold at a previous living situation   Working as a Biomedical engineer:   Patient Instructions  1. Mild intermittent asthma, uncomplicated - Lung testing looks excellent today. - We are not going to make any changes at this time.  - Daily controller medication(s): Symbicort  160/4.5 mcg two puffs twice daily during the winter and the spring. - Prior to physical activity: albuterol  2 puffs 10-15 minutes before physical activity. - Rescue medications: albuterol  4 puffs every 4-6 hours as needed - Asthma control goals:  * Full participation in all desired activities (may need albuterol  before activity) * Albuterol  use two time or less a week on average (not counting use with activity) * Cough interfering with sleep two time or less a month * Oral steroids no more than once a year * No hospitalizations  2. Concern for food allergies (shellfish, orange)  - We will grab a lemon allergy  level to see if this is positive in the blood.  - Testing on the skin was negative, thankfully. - We will call you in 1-2 weeks with the results of the testing,.  - TRY to avoid shrimp (these levels were high on testing). - Emergency Action Plan updated.  - EpiPen  training provided.  - PLEASE AVOID SHRIMP! :-p  3.Seasonal and perennial allergic rhinitis - Testing at the last visit showed: grasses, ragweed, weeds, trees, indoor molds, outdoor molds, dust mites, and cockroach - Continue taking: Xyzal  (levocetirizine) 5mg  tablet once daily, Singulair  (montelukast ) 10mg  daily, Flonase  (fluticasone ) one spray per nostril daily (AIM FOR EAR ON  EACH SIDE), and Astelin  (azelastine ) 2 sprays per nostril 1-2 times daily as needed - Singulair  can cause irritability and bad dreams, so beware of this.  - You can use an extra dose of the antihistamine, if needed, for breakthrough symptoms.  - Consider nasal saline rinses 1-2 times daily to remove allergens from the nasal cavities as well as help with mucous clearance (this is especially helpful to do before the nasal sprays are given) - Consider allergy  shots as a means of long-term control. - Allergy  shots re-train and reset the immune system to ignore environmental allergens and decrease the resulting immune response to those allergens (sneezing, itchy watery eyes, runny nose, nasal congestion, etc).    - Allergy  shots improve symptoms in 75-85% of patients.  - Consent signed today (two vials).  - This would help with your allergies (including postnasal drip) and your breathing.   4. Return in about 6 months (around 08/06/2024). You can have the follow up appointment with Dr. Iva or a Nurse Practicioner (our Nurse Practitioners are excellent and always have Physician oversight!).    Please inform us  of any Emergency Department visits, hospitalizations, or changes in symptoms. Call us  before going to the ED for breathing or allergy  symptoms since we might be able to fit you in for a sick visit. Feel free to contact us  anytime with any questions, problems, or concerns.  It was a pleasure to see you again today!  Websites that have reliable patient information: 1. American Academy of Asthma, Allergy , and Immunology: www.aaaai.org 2. Food Allergy  Research and Education (FARE): foodallergy.org 3.  Mothers of Asthmatics: http://www.asthmacommunitynetwork.org 4. American College of Allergy , Asthma, and Immunology: www.acaai.org      "Like" us  on Facebook and Instagram for our latest updates!      A healthy democracy works best when [employer] participate! Make sure you are  registered to vote! If you have moved or changed any of your contact information, you will need to get this updated before voting! Scan the QR codes below to learn more!       Allergy  Shots  Allergies are the result of a chain reaction that starts in the immune system. Your immune system controls how your body defends itself. For instance, if you have an allergy  to pollen, your immune system identifies pollen as an invader or allergen. Your immune system overreacts by producing antibodies called Immunoglobulin E (IgE). These antibodies travel to cells that release chemicals, causing an allergic reaction.  The concept behind allergy  immunotherapy, whether it is received in the form of shots or tablets, is that the immune system can be desensitized to specific allergens that trigger allergy  symptoms. Although it requires time and patience, the payback can be long-term relief. Allergy  injections contain a dilute solution of those substances that you are allergic to based upon your skin testing and allergy  history.   How Do Allergy  Shots Work?  Allergy  shots work much like a vaccine. Your body responds to injected amounts of a particular allergen given in increasing doses, eventually developing a resistance and tolerance to it. Allergy  shots can lead to decreased, minimal or no allergy  symptoms.  There generally are two phases: build-up and maintenance. Build-up often ranges from three to six months and involves receiving injections with increasing amounts of the allergens. The shots are typically given once or twice a week, though more rapid build-up schedules are sometimes used.  The maintenance phase begins when the most effective dose is reached. This dose is different for each person, depending on how allergic you are and your response to the build-up injections. Once the maintenance dose is reached, there are longer periods between injections, typically two to four weeks.  Occasionally doctors  give cortisone-type shots that can temporarily reduce allergy  symptoms. These types of shots are different and should not be confused with allergy  immunotherapy shots.  Who Can Be Treated with Allergy  Shots?  Allergy  shots may be a good treatment approach for people with allergic rhinitis (hay fever), allergic asthma, conjunctivitis (eye allergy ) or stinging insect allergy .   Before deciding to begin allergy  shots, you should consider:   The length of allergy  season and the severity of your symptoms  Whether medications and/or changes to your environment can control your symptoms  Your desire to avoid long-term medication use  Time: allergy  immunotherapy requires a major time commitment  Cost: may vary depending on your insurance coverage  Allergy  shots for children age 40 and older are effective and often well tolerated. They might prevent the onset of new allergen sensitivities or the progression to asthma.  Allergy  shots are not started on patients who are pregnant but can be continued on patients who become pregnant while receiving them. In some patients with other medical conditions or who take certain common medications, allergy  shots may be of risk. It is important to mention other medications you talk to your allergist.   What are the two types of build-ups offered:   RUSH or Rapid Desensitization -- one day of injections lasting from 8:30-4:30pm, injections every 1 hour.  Approximately half of the build-up  process is completed in that one day.  The following week, normal build-up is resumed, and this entails ~16 visits either weekly or twice weekly, until reaching your "maintenance dose" which is continued weekly until eventually getting spaced out to every month for a duration of 3 to 5 years. The regular build-up appointments are nurse visits where the injections are administered, followed by required monitoring for 30 minutes.    Traditional build-up -- weekly visits for 6 -12  months until reaching "maintenance dose", then continue weekly until eventually spacing out to every 4 weeks as above. At these appointments, the injections are administered, followed by required monitoring for 30 minutes.     Either way is acceptable, and both are equally effective. With the rush protocol, the advantage is that less time is spent here for injections overall AND you would also reach maintenance dosing faster (which is when the clinical benefit starts to become more apparent). Not everyone is a candidate for rapid desensitization.   IF we proceed with the RUSH protocol, there are premedications which must be taken the day before and the day after the rush only (this includes antihistamines, steroids, and Singulair ).  After the rush day, no prednisone  or Singulair  is required, and we just recommend antihistamines taken on your injection day.  What Is An Estimate of the Costs?  If you are interested in starting allergy  injections, please check with your insurance company about your coverage for both allergy  vial sets and allergy  injections.  Please do so prior to making the appointment to start injections.  The following are CPT codes to give to your insurance company. These are the amounts we BILL to the insurance company, but the amount YOU WILL PAY and WE RECEIVE IS SUBSTANTIALLY LESS and depends on the contracts we have with different insurance companies.   Amount Billed to Insurance Two allergy  vial set  CPT 95165   $ 2400     Two injections   CPT 95117   $ 40 RUSH (Rapid Desensitization) CPT 95180 x 8 hours  $500/hour  Regarding the allergy  injections, your co-pay may or may not apply with each injection, so please confirm this with your insurance company. When you start allergy  injections, 1 or 2 sets of vials are made based on your allergies.  Not all patients can be on one set of vials. A set of vials lasts 6 months to a year depending on how quickly you can proceed with your  build-up of your allergy  injections. Vials are personalized for each patient depending on their specific allergens.  How often are allergy  injection given during the build-up period?   Injections are given at least weekly during the build-up period until your maintenance dose is achieved. Per the doctor's discretion, you may have the option of getting allergy  injections two times per week during the build-up period. However, there must be at least 48 hours between injections. The build-up period is usually completed within 6-12 months depending on your ability to schedule injections and for adjustments for reactions. When maintenance dose is reached, your injection schedule is gradually changed to every two weeks and later to every three weeks. Injections will then continue every 4 weeks. Usually, injections are continued for a total of 3-5 years.   When Will I Feel Better?  Some may experience decreased allergy  symptoms during the build-up phase. For others, it may take as long as 12 months on the maintenance dose. If there is no improvement after a year  of maintenance, your allergist will discuss other treatment options with you.  If you aren't responding to allergy  shots, it may be because there is not enough dose of the allergen in your vaccine or there are missing allergens that were not identified during your allergy  testing. Other reasons could be that there are high levels of the allergen in your environment or major exposure to non-allergic triggers like tobacco smoke.  What Is the Length of Treatment?  Once the maintenance dose is reached, allergy  shots are generally continued for three to five years. The decision to stop should be discussed with your allergist at that time. Some people may experience a permanent reduction of allergy  symptoms. Others may relapse and a longer course of allergy  shots can be considered.  What Are the Possible Reactions?  The two types of adverse reactions  that can occur with allergy  shots are local and systemic. Common local reactions include very mild redness and swelling at the injection site, which can happen immediately or several hours after. Report a delayed reaction from your last injection. These include arm swelling or runny nose, watery eyes or cough that occurs within 12-24 hours after injection. A systemic reaction, which is less common, affects the entire body or a particular body system. They are usually mild and typically respond quickly to medications. Signs include increased allergy  symptoms such as sneezing, a stuffy nose or hives.   Rarely, a serious systemic reaction called anaphylaxis can develop. Symptoms include swelling in the throat, wheezing, a feeling of tightness in the chest, nausea or dizziness. Most serious systemic reactions develop within 30 minutes of allergy  shots. This is why it is strongly recommended you wait in your doctor's office for 30 minutes after your injections. Your allergist is trained to watch for reactions, and his or her staff is trained and equipped with the proper medications to identify and treat them.   Report to the nurse immediately if you experience any of the following symptoms: swelling, itching or redness of the skin, hives, watery eyes/nose, breathing difficulty, excessive sneezing, coughing, stomach pain, diarrhea, or light headedness. These symptoms may occur within 15-20 minutes after injection and may require medication.   Who Should Administer Allergy  Shots?  The preferred location for receiving shots is your prescribing allergist's office. Injections can sometimes be given at another facility where the physician and staff are trained to recognize and treat reactions, and have received instructions by your prescribing allergist.  What if I am late for an injection?   Injection dose will be adjusted depending upon how many days or weeks you are late for your injection.   What if I am  sick?   Please report any illness to the nurse before receiving injections. She may adjust your dose or postpone injections depending on your symptoms. If you have fever, flu, sinus infection or chest congestion it is best to postpone allergy  injections until you are better. Never get an allergy  injection if your asthma is causing you problems. If your symptoms persist, seek out medical care to get your health problem under control.  What If I am or Become Pregnant:  Women that become pregnant should schedule an appointment with [facility] before receiving any further allergy  injections.       Subjective:   Anna Dillon is a 28 y.o. female presenting today for follow up of  Chief Complaint  Patient presents with   Food Intolerance    Went on a cruise and  did eat some shrimp. Did have vomiting but didn't have to use epi.      Anna Dillon has a history of the following: Patient Active Problem List   Diagnosis Date Noted   Primary dysmenorrhea 01/08/2024   Menorrhagia 01/08/2024   ASCUS of cervix with negative high risk HPV 01/08/2024   Seasonal and perennial allergic rhinitis 03/05/2023   Anaphylactic shock due to adverse food reaction 03/05/2023   Migraine     History obtained from: chart review and {Persons; PED relatives w/patient:19415::patient}.  Discussed the use of AI scribe software for clinical note transcription with the patient and/or guardian, who gave verbal consent to proceed.  Shilynn is a 27 y.o. female presenting for {Blank single:19197::a food challenge,a drug challenge,skin testing,a sick visit,an evaluation of ***,a follow up visit}. She was last seen in January 2025. At that time, lung testing was not done.we continued with Symbicort  two puffs BID as well as albuterol  as needed. For her food allergie, she continued to avoid shrimp. We recommended Xolair . For her perennial and seasonal allergic rhinitis, we continued with  levocetirizine as well as fluticasone  and azelastine .   Since the last visit, she has done well.  She has a history of allergic reactions to shrimp and oranges. 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 She experienced an allergic reaction during a cruise after consuming shrimp, despite having her EpiPen  available. She notes that she can consume shrimp rolls from a [employer] without issue, possibly due to the small amount of shrimp present.  She also has a history of allergic reactions to oranges, which she suspects may have been the cause of a previous reaction to orange chicken. No allergy  to sesame, as testing was negative. She is concerned about a potential allergy  to lemons, as her throat itches after consuming lemonade, which she enjoys. She has not tested this allergy  yet.  She has been experiencing environmental allergies and recently discontinued her antihistamines. She experiences itching when helping her grandmother with gardening, which she attributes to her environmental allergies. Her current medications include Xyzal , Singulair  (montelukast ), and nasal sprays. She has been off her antihistamines for several days, which has increased her allergy  symptoms, particularly overnight and in the morning.        Asthma/Respiratory Symptom History: ***  Allergic Rhinitis Symptom History: ***  Food Allergy  Symptom History: ***  Skin Symptom History: ***  GERD Symptom History: ***  Infection Symptom History: ***  Otherwise, there have been no changes to her past medical history, surgical history, family history, or social history.    Review of systems otherwise negative other than that mentioned in the HPI.    Objective:   Blood pressure 124/74, pulse 91, temperature 98.8 F (37.1 C), resp. rate 19, SpO2 97%. There is no height or weight on file to calculate BMI.    Physical Exam   Diagnostic studies:     Spirometry: results normal (FEV1: 2.86/93%, FVC: 3.33/92%, FEV1/FVC: 86%).    Spirometry consistent with normal pattern.   Allergy  Studies:     Food Adult Perc - 02/04/24 1600     Time Antigen Placed 1545    Allergen Manufacturer Jestine    Location Arm    Number of allergen test 3     Control-buffer 50% Glycerol Negative    Control-Histamine 3+    56. Lemon Negative          Allergy  testing results were read and interpreted by myself, documented  by clinical staff.      Marty Shaggy, MD  [facility] 

## 2024-02-05 ENCOUNTER — Encounter: Payer: Self-pay | Admitting: Allergy & Immunology

## 2024-02-05 MED ORDER — AZELASTINE HCL 0.1 % NA SOLN: 2.0000 | Freq: Two times a day (BID) | NASAL | 1 refills | Status: AC | PRN

## 2024-02-05 MED ORDER — ALBUTEROL SULFATE HFA 108 (90 BASE) MCG/ACT IN AERS: 2.0000 | INHALATION_SPRAY | RESPIRATORY_TRACT | 1 refills | Status: DC | PRN

## 2024-02-05 MED ORDER — FLUTICASONE PROPIONATE 50 MCG/ACT NA SUSP: 2.0000 | Freq: Two times a day (BID) | NASAL | 1 refills | Status: AC | PRN

## 2024-02-05 MED ORDER — LEVOCETIRIZINE DIHYDROCHLORIDE 5 MG PO TABS: 5.0000 mg | ORAL_TABLET | Freq: Every evening | ORAL | 1 refills | Status: DC

## 2024-02-05 MED ORDER — MONTELUKAST SODIUM 10 MG PO TABS: 10.0000 mg | ORAL_TABLET | Freq: Every day | ORAL | 1 refills | Status: DC

## 2024-02-05 MED ORDER — BUDESONIDE-FORMOTEROL FUMARATE 160-4.5 MCG/ACT IN AERO: 2.0000 | INHALATION_SPRAY | Freq: Two times a day (BID) | RESPIRATORY_TRACT | 5 refills | Status: DC

## 2024-02-07 LAB — ALLERGEN, LEMON, F208: Lemon: <0.1 kU/L

## 2024-02-13 LAB — HM PAP SMEAR

## 2024-02-14 ENCOUNTER — Ambulatory Visit: Payer: Self-pay | Admitting: Allergy & Immunology

## 2024-03-25 ENCOUNTER — Other Ambulatory Visit: Payer: Self-pay | Admitting: Family Medicine

## 2024-03-25 DIAGNOSIS — R102 Pelvic and perineal pain: Secondary | ICD-10-CM

## 2024-03-26 ENCOUNTER — Ambulatory Visit
Admission: RE | Admit: 2024-03-26 | Discharge: 2024-03-26 | Disposition: A | Discharge status: Home / Self Care | Source: Ambulatory Visit | Attending: Family Medicine | Admitting: Family Medicine

## 2024-03-26 DIAGNOSIS — R102 Pelvic and perineal pain: Secondary | ICD-10-CM

## 2024-08-06 ENCOUNTER — Ambulatory Visit: Admitting: Allergy & Immunology

## 2024-08-06 ENCOUNTER — Other Ambulatory Visit: Payer: Self-pay

## 2024-08-06 VITALS — BP 110/74 | HR 87 | Temp 98.0°F | Ht 67.5 in | Wt 188.1 lb

## 2024-08-06 DIAGNOSIS — J302 Other seasonal allergic rhinitis: Secondary | ICD-10-CM

## 2024-08-06 DIAGNOSIS — J4531 Mild persistent asthma with (acute) exacerbation: Secondary | ICD-10-CM

## 2024-08-06 DIAGNOSIS — J4520 Mild intermittent asthma, uncomplicated: Secondary | ICD-10-CM

## 2024-08-06 DIAGNOSIS — T7802XD Anaphylactic reaction due to shellfish (crustaceans), subsequent encounter: Secondary | ICD-10-CM

## 2024-08-06 MED ORDER — AZITHROMYCIN 250 MG PO TABS: ORAL_TABLET | ORAL | 0 refills | Status: AC

## 2024-08-06 MED ORDER — EPINEPHRINE 0.3 MG/0.3ML IJ SOAJ: 0.3000 mg | INTRAMUSCULAR | 1 refills | Status: AC | PRN

## 2024-08-06 MED ORDER — LEVOCETIRIZINE DIHYDROCHLORIDE 5 MG PO TABS: 5.0000 mg | ORAL_TABLET | Freq: Every evening | ORAL | 1 refills | Status: AC

## 2024-08-06 MED ORDER — BUDESONIDE-FORMOTEROL FUMARATE 160-4.5 MCG/ACT IN AERO: 2.0000 | INHALATION_SPRAY | Freq: Two times a day (BID) | RESPIRATORY_TRACT | 5 refills | Status: AC

## 2024-08-06 MED ORDER — MONTELUKAST SODIUM 10 MG PO TABS: 10.0000 mg | ORAL_TABLET | Freq: Every day | ORAL | 1 refills | Status: AC

## 2024-08-06 MED ORDER — METHYLPREDNISOLONE ACETATE 80 MG/ML IJ SUSP
80.0000 mg | Freq: Once | INTRAMUSCULAR | Status: AC
  Administered 2024-08-06: 80 mg via INTRAMUSCULAR

## 2024-08-06 MED ORDER — ALBUTEROL SULFATE HFA 108 (90 BASE) MCG/ACT IN AERS: 2.0000 | INHALATION_SPRAY | RESPIRATORY_TRACT | 1 refills | Status: AC | PRN

## 2024-08-06 MED ORDER — PREDNISONE 10 MG PO TABS: ORAL_TABLET | ORAL | 0 refills | Status: AC

## 2024-08-06 NOTE — Patient Instructions (Addendum)
 1. Moderate persistent asthma with coexisting asthma exacerbation - Lung testing looks excellent today. - But since you are having such problems, we are going to start you on a prednisone  taper following a DepoMedrol injection.  - Add on azithromycin  course: two tablets on the first day and then one tablet daily for four more days.  - Nebulizer machine given today.  - Daily controller medication(s): Symbicort  160/4.5 mcg two puffs twice daily during the winter and the spring. - Prior to physical activity: albuterol  2 puffs 10-15 minutes before physical activity. - Rescue medications: albuterol  4 puffs every 4-6 hours as needed and albuterol  nebulizer one vial every 4-6 hours as needed - Asthma control goals:  * Full participation in all desired activities (may need albuterol  before activity) * Albuterol  use two time or less a week on average (not counting use with activity) * Cough interfering with sleep two time or less a month * Oral steroids no more than once a year * No hospitalizations  2. Concern for food allergies (shellfish, orange)  - Continue to avoid shrimp like you are doing.  - Emergency Action Plan updated.  - EpiPen  training provided.   3.Seasonal and perennial allergic rhinitis - Testing in the past showed: grasses, ragweed, weeds, trees, indoor molds, outdoor molds, dust mites, and cockroach - Continue taking: Xyzal  (levocetirizine) 5mg  tablet once daily, Singulair  (montelukast ) 10mg  daily, Flonase  (fluticasone ) one spray per nostril daily (AIM FOR EAR ON EACH SIDE), and Astelin  (azelastine ) 2 sprays per nostril 1-2 times daily as needed - You can use an extra dose of the antihistamine, if needed, for breakthrough symptoms.  - Consider nasal saline rinses 1-2 times daily to remove allergens from the nasal cavities as well as help with mucous clearance (this is especially helpful to do before the nasal sprays are given) - Consider allergy  shots as a means of long-term  control. - Allergy  shots re-train and reset the immune system to ignore environmental allergens and decrease the resulting immune response to those allergens (sneezing, itchy watery eyes, runny nose, nasal congestion, etc).    - Allergy  shots improve symptoms in 75-85% of patients.  - Consent signed today (two vials).  - This would help with your allergies (including postnasal drip) and your breathing.   4. Return in about 3 months (around 11/03/2024). You can have the follow up appointment with Dr. Iva or a Nurse Practicioner (our Nurse Practitioners are excellent and always have Physician oversight!).    Please inform us  of any Emergency Department visits, hospitalizations, or changes in symptoms. Call us  before going to the ED for breathing or allergy  symptoms since we might be able to fit you in for a sick visit. Feel free to contact us  anytime with any questions, problems, or concerns.  It was a pleasure to see you again today!  Websites that have reliable patient information: 1. American Academy of Asthma, Allergy , and Immunology: www.aaaai.org 2. Food Allergy  Research and Education (FARE): foodallergy.org 3. Mothers of Asthmatics: http://www.asthmacommunitynetwork.org 4. American College of Allergy , Asthma, and Immunology: www.acaai.org      Like us  on [employer] and Instagram for our latest updates!      A healthy democracy works best when [employer] participate! Make sure you are registered to vote! If you have moved or changed any of your contact information, you will need to get this updated before voting! Scan the QR codes below to learn more!       Allergy  Shots  Allergies are the result  of a chain reaction that starts in the immune system. Your immune system controls how your body defends itself. For instance, if you have an allergy  to pollen, your immune system identifies pollen as an invader or allergen. Your immune system overreacts by producing antibodies  called Immunoglobulin E (IgE). These antibodies travel to cells that release chemicals, causing an allergic reaction.  The concept behind allergy  immunotherapy, whether it is received in the form of shots or tablets, is that the immune system can be desensitized to specific allergens that trigger allergy  symptoms. Although it requires time and patience, the payback can be long-term relief. Allergy  injections contain a dilute solution of those substances that you are allergic to based upon your skin testing and allergy  history.   How Do Allergy  Shots Work?  Allergy  shots work much like a vaccine. Your body responds to injected amounts of a particular allergen given in increasing doses, eventually developing a resistance and tolerance to it. Allergy  shots can lead to decreased, minimal or no allergy  symptoms.  There generally are two phases: build-up and maintenance. Build-up often ranges from three to six months and involves receiving injections with increasing amounts of the allergens. The shots are typically given once or twice a week, though more rapid build-up schedules are sometimes used.  The maintenance phase begins when the most effective dose is reached. This dose is different for each person, depending on how allergic you are and your response to the build-up injections. Once the maintenance dose is reached, there are longer periods between injections, typically two to four weeks.  Occasionally doctors give cortisone-type shots that can temporarily reduce allergy  symptoms. These types of shots are different and should not be confused with allergy  immunotherapy shots.  Who Can Be Treated with Allergy  Shots?  Allergy  shots may be a good treatment approach for people with allergic rhinitis (hay fever), allergic asthma, conjunctivitis (eye allergy ) or stinging insect allergy .   Before deciding to begin allergy  shots, you should consider:   The length of allergy  season and the severity of  your symptoms  Whether medications and/or changes to your environment can control your symptoms  Your desire to avoid long-term medication use  Time: allergy  immunotherapy requires a major time commitment  Cost: may vary depending on your insurance coverage  Allergy  shots for children age 53 and older are effective and often well tolerated. They might prevent the onset of new allergen sensitivities or the progression to asthma.  Allergy  shots are not started on patients who are pregnant but can be continued on patients who become pregnant while receiving them. In some patients with other medical conditions or who take certain common medications, allergy  shots may be of risk. It is important to mention other medications you talk to your allergist.   What are the two types of build-ups offered:   RUSH or Rapid Desensitization -- one day of injections lasting from 8:30-4:30pm, injections every 1 hour.  Approximately half of the build-up process is completed in that one day.  The following week, normal build-up is resumed, and this entails ~16 visits either weekly or twice weekly, until reaching your maintenance dose which is continued weekly until eventually getting spaced out to every month for a duration of 3 to 5 years. The regular build-up appointments are nurse visits where the injections are administered, followed by required monitoring for 30 minutes.    Traditional build-up -- weekly visits for 6 -12 months until reaching maintenance dose, then continue weekly until eventually  spacing out to every 4 weeks as above. At these appointments, the injections are administered, followed by required monitoring for 30 minutes.     Either way is acceptable, and both are equally effective. With the rush protocol, the advantage is that less time is spent here for injections overall AND you would also reach maintenance dosing faster (which is when the clinical benefit starts to become more apparent).  Not everyone is a candidate for rapid desensitization.   IF we proceed with the RUSH protocol, there are premedications which must be taken the day before and the day after the rush only (this includes antihistamines, steroids, and Singulair ).  After the rush day, no prednisone  or Singulair  is required, and we just recommend antihistamines taken on your injection day.  What Is An Estimate of the Costs?  If you are interested in starting allergy  injections, please check with your insurance company about your coverage for both allergy  vial sets and allergy  injections.  Please do so prior to making the appointment to start injections.  The following are CPT codes to give to your insurance company. These are the amounts we BILL to the insurance company, but the amount YOU WILL PAY and WE RECEIVE IS SUBSTANTIALLY LESS and depends on the contracts we have with different insurance companies.   Amount Billed to Insurance Two allergy  vial set  CPT 95165   $ 2400     Two injections   CPT 95117   $ 40 RUSH (Rapid Desensitization) CPT 95180 x 8 hours  $500/hour  Regarding the allergy  injections, your co-pay may or may not apply with each injection, so please confirm this with your insurance company. When you start allergy  injections, 1 or 2 sets of vials are made based on your allergies.  Not all patients can be on one set of vials. A set of vials lasts 6 months to a year depending on how quickly you can proceed with your build-up of your allergy  injections. Vials are personalized for each patient depending on their specific allergens.  How often are allergy  injection given during the build-up period?   Injections are given at least weekly during the build-up period until your maintenance dose is achieved. Per the doctor's discretion, you may have the option of getting allergy  injections two times per week during the build-up period. However, there must be at least 48 hours between injections. The build-up  period is usually completed within 6-12 months depending on your ability to schedule injections and for adjustments for reactions. When maintenance dose is reached, your injection schedule is gradually changed to every two weeks and later to every three weeks. Injections will then continue every 4 weeks. Usually, injections are continued for a total of 3-5 years.   When Will I Feel Better?  Some may experience decreased allergy  symptoms during the build-up phase. For others, it may take as long as 12 months on the maintenance dose. If there is no improvement after a year of maintenance, your allergist will discuss other treatment options with you.  If you arent responding to allergy  shots, it may be because there is not enough dose of the allergen in your vaccine or there are missing allergens that were not identified during your allergy  testing. Other reasons could be that there are high levels of the allergen in your environment or major exposure to non-allergic triggers like tobacco smoke.  What Is the Length of Treatment?  Once the maintenance dose is reached, allergy  shots are generally continued  for three to five years. The decision to stop should be discussed with your allergist at that time. Some people may experience a permanent reduction of allergy  symptoms. Others may relapse and a longer course of allergy  shots can be considered.  What Are the Possible Reactions?  The two types of adverse reactions that can occur with allergy  shots are local and systemic. Common local reactions include very mild redness and swelling at the injection site, which can happen immediately or several hours after. Report a delayed reaction from your last injection. These include arm swelling or runny nose, watery eyes or cough that occurs within 12-24 hours after injection. A systemic reaction, which is less common, affects the entire body or a particular body system. They are usually mild and typically respond  quickly to medications. Signs include increased allergy  symptoms such as sneezing, a stuffy nose or hives.   Rarely, a serious systemic reaction called anaphylaxis can develop. Symptoms include swelling in the throat, wheezing, a feeling of tightness in the chest, nausea or dizziness. Most serious systemic reactions develop within 30 minutes of allergy  shots. This is why it is strongly recommended you wait in your doctors office for 30 minutes after your injections. Your allergist is trained to watch for reactions, and his or her staff is trained and equipped with the proper medications to identify and treat them.   Report to the nurse immediately if you experience any of the following symptoms: swelling, itching or redness of the skin, hives, watery eyes/nose, breathing difficulty, excessive sneezing, coughing, stomach pain, diarrhea, or light headedness. These symptoms may occur within 15-20 minutes after injection and may require medication.   Who Should Administer Allergy  Shots?  The preferred location for receiving shots is your prescribing allergists office. Injections can sometimes be given at another facility where the physician and staff are trained to recognize and treat reactions, and have received instructions by your prescribing allergist.  What if I am late for an injection?   Injection dose will be adjusted depending upon how many days or weeks you are late for your injection.   What if I am sick?   Please report any illness to the nurse before receiving injections. She may adjust your dose or postpone injections depending on your symptoms. If you have fever, flu, sinus infection or chest congestion it is best to postpone allergy  injections until you are better. Never get an allergy  injection if your asthma is causing you problems. If your symptoms persist, seek out medical care to get your health problem under control.  What If I am or Become Pregnant:  Women that become pregnant  should schedule an appointment with [facility] before receiving any further allergy  injections.

## 2024-08-06 NOTE — Progress Notes (Unsigned)
 "  FOLLOW UP  Date of Service/Encounter:  08/06/24   Assessment:   Mild intermittent asthma, uncomplicated   Concern for food allergies (shellfish, orange) - negative testing to lemon today (confirming with blood work)   Seasonal and perennial allergic rhinitis (grasses, ragweed, weeds, trees, indoor molds, outdoor molds, dust mites, and cockroach)   History of exposure to indoor mold at a previous living situation   Working as a Biomedical Engineer:   Patient Instructions  1. Moderate persistent asthma with coexisting asthma exacerbation - Lung testing looks excellent today. - But since you are having such problems, we are going to start you on a prednisone  taper following a DepoMedrol injection.  - Add on azithromycin  course: two tablets on the first day and then one tablet daily for four more days.  - Nebulizer machine given today.  - Daily controller medication(s): Symbicort  160/4.5 mcg two puffs twice daily during the winter and the spring. - Prior to physical activity: albuterol  2 puffs 10-15 minutes before physical activity. - Rescue medications: albuterol  4 puffs every 4-6 hours as needed and albuterol  nebulizer one vial every 4-6 hours as needed - Asthma control goals:  * Full participation in all desired activities (may need albuterol  before activity) * Albuterol  use two time or less a week on average (not counting use with activity) * Cough interfering with sleep two time or less a month * Oral steroids no more than once a year * No hospitalizations  2. Concern for food allergies (shellfish, orange)  - Continue to avoid shrimp like you are doing.  - Emergency Action Plan updated.  - EpiPen  training provided.   3.Seasonal and perennial allergic rhinitis - Testing in the past showed: grasses, ragweed, weeds, trees, indoor molds, outdoor molds, dust mites, and cockroach - Continue taking: Xyzal  (levocetirizine) 5mg  tablet once daily, Singulair   (montelukast ) 10mg  daily, Flonase  (fluticasone ) one spray per nostril daily (AIM FOR EAR ON EACH SIDE), and Astelin  (azelastine ) 2 sprays per nostril 1-2 times daily as needed - You can use an extra dose of the antihistamine, if needed, for breakthrough symptoms.  - Consider nasal saline rinses 1-2 times daily to remove allergens from the nasal cavities as well as help with mucous clearance (this is especially helpful to do before the nasal sprays are given) - Consider allergy  shots as a means of long-term control. - Allergy  shots re-train and reset the immune system to ignore environmental allergens and decrease the resulting immune response to those allergens (sneezing, itchy watery eyes, runny nose, nasal congestion, etc).    - Allergy  shots improve symptoms in 75-85% of patients.  - Consent signed today (two vials).  - This would help with your allergies (including postnasal drip) and your breathing.   4. Return in about 3 months (around 11/03/2024). You can have the follow up appointment with Dr. Iva or a Nurse Practicioner (our Nurse Practitioners are excellent and always have Physician oversight!).    Please inform us  of any Emergency Department visits, hospitalizations, or changes in symptoms. Call us  before going to the ED for breathing or allergy  symptoms since we might be able to fit you in for a sick visit. Feel free to contact us  anytime with any questions, problems, or concerns.  It was a pleasure to see you again today!  Websites that have reliable patient information: 1. American Academy of Asthma, Allergy , and Immunology: www.aaaai.org 2. Food Allergy  Research and Education (FARE): foodallergy.org 3. Mothers of Asthmatics: http://www.asthmacommunitynetwork.org 4. [employer] of  Allergy , Asthma, and Immunology: www.acaai.org      Like us  on [employer] and Instagram for our latest updates!      A healthy democracy works best when [employer] participate! Make  sure you are registered to vote! If you have moved or changed any of your contact information, you will need to get this updated before voting! Scan the QR codes below to learn more!       Allergy  Shots  Allergies are the result of a chain reaction that starts in the immune system. Your immune system controls how your body defends itself. For instance, if you have an allergy  to pollen, your immune system identifies pollen as an invader or allergen. Your immune system overreacts by producing antibodies called Immunoglobulin E (IgE). These antibodies travel to cells that release chemicals, causing an allergic reaction.  The concept behind allergy  immunotherapy, whether it is received in the form of shots or tablets, is that the immune system can be desensitized to specific allergens that trigger allergy  symptoms. Although it requires time and patience, the payback can be long-term relief. Allergy  injections contain a dilute solution of those substances that you are allergic to based upon your skin testing and allergy  history.   How Do Allergy  Shots Work?  Allergy  shots work much like a vaccine. Your body responds to injected amounts of a particular allergen given in increasing doses, eventually developing a resistance and tolerance to it. Allergy  shots can lead to decreased, minimal or no allergy  symptoms.  There generally are two phases: build-up and maintenance. Build-up often ranges from three to six months and involves receiving injections with increasing amounts of the allergens. The shots are typically given once or twice a week, though more rapid build-up schedules are sometimes used.  The maintenance phase begins when the most effective dose is reached. This dose is different for each person, depending on how allergic you are and your response to the build-up injections. Once the maintenance dose is reached, there are longer periods between injections, typically two to four  weeks.  Occasionally doctors give cortisone-type shots that can temporarily reduce allergy  symptoms. These types of shots are different and should not be confused with allergy  immunotherapy shots.  Who Can Be Treated with Allergy  Shots?  Allergy  shots may be a good treatment approach for people with allergic rhinitis (hay fever), allergic asthma, conjunctivitis (eye allergy ) or stinging insect allergy .   Before deciding to begin allergy  shots, you should consider:   The length of allergy  season and the severity of your symptoms  Whether medications and/or changes to your environment can control your symptoms  Your desire to avoid long-term medication use  Time: allergy  immunotherapy requires a major time commitment  Cost: may vary depending on your insurance coverage  Allergy  shots for children age 21 and older are effective and often well tolerated. They might prevent the onset of new allergen sensitivities or the progression to asthma.  Allergy  shots are not started on patients who are pregnant but can be continued on patients who become pregnant while receiving them. In some patients with other medical conditions or who take certain common medications, allergy  shots may be of risk. It is important to mention other medications you talk to your allergist.   What are the two types of build-ups offered:   RUSH or Rapid Desensitization -- one day of injections lasting from 8:30-4:30pm, injections every 1 hour.  Approximately half of the build-up process is completed in that one day.  The following week, normal build-up is resumed, and this entails ~16 visits either weekly or twice weekly, until reaching your maintenance dose which is continued weekly until eventually getting spaced out to every month for a duration of 3 to 5 years. The regular build-up appointments are nurse visits where the injections are administered, followed by required monitoring for 30 minutes.    Traditional  build-up -- weekly visits for 6 -12 months until reaching maintenance dose, then continue weekly until eventually spacing out to every 4 weeks as above. At these appointments, the injections are administered, followed by required monitoring for 30 minutes.     Either way is acceptable, and both are equally effective. With the rush protocol, the advantage is that less time is spent here for injections overall AND you would also reach maintenance dosing faster (which is when the clinical benefit starts to become more apparent). Not everyone is a candidate for rapid desensitization.   IF we proceed with the RUSH protocol, there are premedications which must be taken the day before and the day after the rush only (this includes antihistamines, steroids, and Singulair ).  After the rush day, no prednisone  or Singulair  is required, and we just recommend antihistamines taken on your injection day.  What Is An Estimate of the Costs?  If you are interested in starting allergy  injections, please check with your insurance company about your coverage for both allergy  vial sets and allergy  injections.  Please do so prior to making the appointment to start injections.  The following are CPT codes to give to your insurance company. These are the amounts we BILL to the insurance company, but the amount YOU WILL PAY and WE RECEIVE IS SUBSTANTIALLY LESS and depends on the contracts we have with different insurance companies.   Amount Billed to Insurance Two allergy  vial set  CPT 95165   $ 2400     Two injections   CPT 95117   $ 40 RUSH (Rapid Desensitization) CPT 95180 x 8 hours  $500/hour  Regarding the allergy  injections, your co-pay may or may not apply with each injection, so please confirm this with your insurance company. When you start allergy  injections, 1 or 2 sets of vials are made based on your allergies.  Not all patients can be on one set of vials. A set of vials lasts 6 months to a year depending on how  quickly you can proceed with your build-up of your allergy  injections. Vials are personalized for each patient depending on their specific allergens.  How often are allergy  injection given during the build-up period?   Injections are given at least weekly during the build-up period until your maintenance dose is achieved. Per the doctor's discretion, you may have the option of getting allergy  injections two times per week during the build-up period. However, there must be at least 48 hours between injections. The build-up period is usually completed within 6-12 months depending on your ability to schedule injections and for adjustments for reactions. When maintenance dose is reached, your injection schedule is gradually changed to every two weeks and later to every three weeks. Injections will then continue every 4 weeks. Usually, injections are continued for a total of 3-5 years.   When Will I Feel Better?  Some may experience decreased allergy  symptoms during the build-up phase. For others, it may take as long as 12 months on the maintenance dose. If there is no improvement after a year of maintenance, your allergist will discuss other treatment  options with you.  If you arent responding to allergy  shots, it may be because there is not enough dose of the allergen in your vaccine or there are missing allergens that were not identified during your allergy  testing. Other reasons could be that there are high levels of the allergen in your environment or major exposure to non-allergic triggers like tobacco smoke.  What Is the Length of Treatment?  Once the maintenance dose is reached, allergy  shots are generally continued for three to five years. The decision to stop should be discussed with your allergist at that time. Some people may experience a permanent reduction of allergy  symptoms. Others may relapse and a longer course of allergy  shots can be considered.  What Are the Possible Reactions?  The  two types of adverse reactions that can occur with allergy  shots are local and systemic. Common local reactions include very mild redness and swelling at the injection site, which can happen immediately or several hours after. Report a delayed reaction from your last injection. These include arm swelling or runny nose, watery eyes or cough that occurs within 12-24 hours after injection. A systemic reaction, which is less common, affects the entire body or a particular body system. They are usually mild and typically respond quickly to medications. Signs include increased allergy  symptoms such as sneezing, a stuffy nose or hives.   Rarely, a serious systemic reaction called anaphylaxis can develop. Symptoms include swelling in the throat, wheezing, a feeling of tightness in the chest, nausea or dizziness. Most serious systemic reactions develop within 30 minutes of allergy  shots. This is why it is strongly recommended you wait in your doctors office for 30 minutes after your injections. Your allergist is trained to watch for reactions, and his or her staff is trained and equipped with the proper medications to identify and treat them.   Report to the nurse immediately if you experience any of the following symptoms: swelling, itching or redness of the skin, hives, watery eyes/nose, breathing difficulty, excessive sneezing, coughing, stomach pain, diarrhea, or light headedness. These symptoms may occur within 15-20 minutes after injection and may require medication.   Who Should Administer Allergy  Shots?  The preferred location for receiving shots is your prescribing allergists office. Injections can sometimes be given at another facility where the physician and staff are trained to recognize and treat reactions, and have received instructions by your prescribing allergist.  What if I am late for an injection?   Injection dose will be adjusted depending upon how many days or weeks you are late for your  injection.   What if I am sick?   Please report any illness to the nurse before receiving injections. She may adjust your dose or postpone injections depending on your symptoms. If you have fever, flu, sinus infection or chest congestion it is best to postpone allergy  injections until you are better. Never get an allergy  injection if your asthma is causing you problems. If your symptoms persist, seek out medical care to get your health problem under control.  What If I am or Become Pregnant:  Women that become pregnant should schedule an appointment with [facility] before receiving any further allergy  injections.      Subjective:   Azaylia Fong is a 29 y.o. female presenting today for follow up of  Chief Complaint  Patient presents with   Asthma    Coughing spells continuously. Most days can't breath. Has inhalers on hand. [place]. Can't cough up mucous.    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  Discussed the use of AI scribe software for clinical note transcription with the patient and/or guardian, who gave verbal consent to proceed.  Mckinsey is a 29 y.o. female presenting for {Blank single:19197::a food challenge,a drug challenge,skin testing,a sick visit,an evaluation of ***,a follow up visit}.  She was last seen in August 2025.  At that time, we continue with Symbicort  160 mcg 2 puffs twice daily as well as albuterol  as needed.  For her concern for food allergies  including shellfish and orange, we obtained a Lyme in lab which was negative.  We recommended avoiding shrimp since the levels were hide.  We gave her an EpiPen .  She had environmental allergy  testing that was +12 for indoor and outdoor allergens.  We continue with her Xyzal , Singulair , Flonase , and Astelin .  Asthma/Respiratory Symptom History: ***  Allergic Rhinitis Symptom History: ***  Food Allergy  Symptom History: ***  Skin Symptom History: ***  GERD Symptom History: ***  Infection Symptom History: ***  Otherwise, there have been no changes to her past medical history, surgical history, family history, or social history.    Review of systems otherwise negative other than that mentioned in the HPI.    Objective:   Blood pressure 110/74, pulse 87, temperature 98 F (36.7 C), height 5' 7.5 (1.715 m), weight 188 lb 1.6 oz (85.3 kg), SpO2 100%. Body mass index is 29.03 kg/m.    Physical Exam   Diagnostic studies:    Spirometry: results normal (FEV1: 3.23/105%, FVC: 3.76/104%, FEV1/FVC: 86%).    Spirometry consistent with normal pattern.    Allergy  Studies: {Blank single:19197::none,deferred due to recent antihistamine use,deferred due to insurance stipulations that require a separate visit for testing,labs sent instead, }    {Blank single:19197::Allergy  testing results were read and interpreted by myself, documented by clinical staff., }      Marty Shaggy, MD  [facility]       / "

## 2024-08-07 ENCOUNTER — Encounter: Payer: Self-pay | Admitting: Allergy & Immunology

## 2024-11-11 ENCOUNTER — Ambulatory Visit: Admitting: Obstetrics and Gynecology

## 2024-11-17 ENCOUNTER — Ambulatory Visit: Admitting: Allergy & Immunology
